# Patient Record
Sex: Female | Born: 1990 | Race: White | Hispanic: No | Smoking: Current every day smoker
Health system: Southern US, Community
[De-identification: ages and names within clinical notes are randomized; demographics above are authoritative.]

## PROBLEM LIST (undated history)

## (undated) DIAGNOSIS — F329 Major depressive disorder, single episode, unspecified: Secondary | ICD-10-CM

## (undated) DIAGNOSIS — F419 Anxiety disorder, unspecified: Secondary | ICD-10-CM

## (undated) DIAGNOSIS — F32A Depression, unspecified: Secondary | ICD-10-CM

## (undated) DIAGNOSIS — F609 Personality disorder, unspecified: Secondary | ICD-10-CM

## (undated) DIAGNOSIS — F429 Obsessive-compulsive disorder, unspecified: Secondary | ICD-10-CM

## (undated) DIAGNOSIS — R87619 Unspecified abnormal cytological findings in specimens from cervix uteri: Secondary | ICD-10-CM

## (undated) DIAGNOSIS — F319 Bipolar disorder, unspecified: Secondary | ICD-10-CM

## (undated) HISTORY — PX: CHOLECYSTECTOMY: SHX55

## (undated) HISTORY — PX: TUBAL LIGATION: SHX77

## (undated) HISTORY — DX: Obsessive-compulsive disorder, unspecified: F42.9

## (undated) HISTORY — DX: Major depressive disorder, single episode, unspecified: F32.9

## (undated) HISTORY — DX: Unspecified abnormal cytological findings in specimens from cervix uteri: R87.619

## (undated) HISTORY — DX: Bipolar disorder, unspecified: F31.9

## (undated) HISTORY — DX: Personality disorder, unspecified: F60.9

## (undated) HISTORY — DX: Depression, unspecified: F32.A

## (undated) HISTORY — PX: WISDOM TOOTH EXTRACTION: SHX21

## (undated) HISTORY — DX: Anxiety disorder, unspecified: F41.9

## (undated) HISTORY — PX: BUNIONECTOMY: SHX129

---

## 2012-03-28 DIAGNOSIS — F419 Anxiety disorder, unspecified: Secondary | ICD-10-CM | POA: Insufficient documentation

## 2012-05-10 DIAGNOSIS — IMO0002 Reserved for concepts with insufficient information to code with codable children: Secondary | ICD-10-CM | POA: Insufficient documentation

## 2013-02-12 DIAGNOSIS — Z8489 Family history of other specified conditions: Secondary | ICD-10-CM | POA: Insufficient documentation

## 2013-08-28 DIAGNOSIS — F3131 Bipolar disorder, current episode depressed, mild: Secondary | ICD-10-CM | POA: Insufficient documentation

## 2013-08-28 DIAGNOSIS — F1420 Cocaine dependence, uncomplicated: Secondary | ICD-10-CM | POA: Insufficient documentation

## 2013-08-28 DIAGNOSIS — F1220 Cannabis dependence, uncomplicated: Secondary | ICD-10-CM | POA: Insufficient documentation

## 2014-02-24 ENCOUNTER — Encounter: Payer: Self-pay | Admitting: Advanced Practice Midwife

## 2014-02-24 ENCOUNTER — Other Ambulatory Visit: Payer: Self-pay | Admitting: Advanced Practice Midwife

## 2014-02-24 ENCOUNTER — Ambulatory Visit (INDEPENDENT_AMBULATORY_CARE_PROVIDER_SITE_OTHER): Payer: Medicaid Other | Admitting: Advanced Practice Midwife

## 2014-02-24 VITALS — BP 116/84 | HR 79 | Ht 63.0 in | Wt 222.0 lb

## 2014-02-24 DIAGNOSIS — O09299 Supervision of pregnancy with other poor reproductive or obstetric history, unspecified trimester: Secondary | ICD-10-CM

## 2014-02-24 DIAGNOSIS — F1911 Other psychoactive substance abuse, in remission: Secondary | ICD-10-CM

## 2014-02-24 DIAGNOSIS — Z3491 Encounter for supervision of normal pregnancy, unspecified, first trimester: Secondary | ICD-10-CM

## 2014-02-24 DIAGNOSIS — O219 Vomiting of pregnancy, unspecified: Secondary | ICD-10-CM

## 2014-02-24 DIAGNOSIS — F32A Depression, unspecified: Secondary | ICD-10-CM | POA: Insufficient documentation

## 2014-02-24 DIAGNOSIS — R6889 Other general symptoms and signs: Secondary | ICD-10-CM

## 2014-02-24 DIAGNOSIS — F4310 Post-traumatic stress disorder, unspecified: Secondary | ICD-10-CM | POA: Insufficient documentation

## 2014-02-24 DIAGNOSIS — R87619 Unspecified abnormal cytological findings in specimens from cervix uteri: Secondary | ICD-10-CM | POA: Insufficient documentation

## 2014-02-24 DIAGNOSIS — IMO0002 Reserved for concepts with insufficient information to code with codable children: Secondary | ICD-10-CM | POA: Insufficient documentation

## 2014-02-24 DIAGNOSIS — Z3490 Encounter for supervision of normal pregnancy, unspecified, unspecified trimester: Secondary | ICD-10-CM | POA: Insufficient documentation

## 2014-02-24 DIAGNOSIS — Z3480 Encounter for supervision of other normal pregnancy, unspecified trimester: Secondary | ICD-10-CM

## 2014-02-24 DIAGNOSIS — O09893 Supervision of other high risk pregnancies, third trimester: Secondary | ICD-10-CM

## 2014-02-24 DIAGNOSIS — Z3481 Encounter for supervision of other normal pregnancy, first trimester: Secondary | ICD-10-CM

## 2014-02-24 DIAGNOSIS — F329 Major depressive disorder, single episode, unspecified: Secondary | ICD-10-CM | POA: Insufficient documentation

## 2014-02-24 MED ORDER — PROMETHAZINE HCL 25 MG PO TABS: 25.0000 mg | ORAL_TABLET | Freq: Four times a day (QID) | ORAL | Status: DC | PRN

## 2014-02-24 MED ORDER — DOXYLAMINE-PYRIDOXINE 10-10 MG PO TBEC: 2.0000 | DELAYED_RELEASE_TABLET | ORAL | Status: DC

## 2014-02-24 NOTE — Progress Notes (Signed)
Last pap this year @ [address]

## 2014-02-24 NOTE — Patient Instructions (Signed)
Second Trimester of Pregnancy The second trimester is from week 13 through week 28, months 4 through 6. The second trimester is often a time when you feel your best. Your body has also adjusted to being pregnant, and you begin to feel better physically. Usually, morning sickness has lessened or quit completely, you may have more energy, and you may have an increase in appetite. The second trimester is also a time when the fetus is growing rapidly. At the end of the sixth month, the fetus is about 9 inches long and weighs about 1 pounds. You will likely begin to feel the baby move (quickening) between 18 and 20 weeks of the pregnancy. BODY CHANGES Your body goes through many changes during pregnancy. The changes vary from woman to woman.   Your weight will continue to increase. You will notice your lower abdomen bulging out.  You may begin to get stretch marks on your hips, abdomen, and breasts.  You may develop headaches that can be relieved by medicines approved by your health care provider.  You may urinate more often because the fetus is pressing on your bladder.  You may develop or continue to have heartburn as a result of your pregnancy.  You may develop constipation because certain hormones are causing the muscles that push waste through your intestines to slow down.  You may develop hemorrhoids or swollen, bulging veins (varicose veins).  You may have back pain because of the weight gain and pregnancy hormones relaxing your joints between the bones in your pelvis and as a result of a shift in weight and the muscles that support your balance.  Your breasts will continue to grow and be tender.  Your gums may bleed and may be sensitive to brushing and flossing.  Dark spots or blotches (chloasma, mask of pregnancy) may develop on your face. This will likely fade after the baby is born.  A dark line from your belly button to the pubic area (linea nigra) may appear. This will likely fade  after the baby is born.  You may have changes in your hair. These can include thickening of your hair, rapid growth, and changes in texture. Some women also have hair loss during or after pregnancy, or hair that feels dry or thin. Your hair will most likely return to normal after your baby is born. WHAT TO EXPECT AT YOUR PRENATAL VISITS During a routine prenatal visit:  You will be weighed to make sure you and the fetus are growing normally.  Your blood pressure will be taken.  Your abdomen will be measured to track your baby's growth.  The fetal heartbeat will be listened to.  Any test results from the previous visit will be discussed. Your health care provider may ask you:  How you are feeling.  If you are feeling the baby move.  If you have had any abnormal symptoms, such as leaking fluid, bleeding, severe headaches, or abdominal cramping.  If you have any questions. Other tests that may be performed during your second trimester include:  Blood tests that check for:  Low iron levels (anemia).  Gestational diabetes (between 24 and 28 weeks).  Rh antibodies.  Urine tests to check for infections, diabetes, or protein in the urine.  An ultrasound to confirm the proper growth and development of the baby.  An amniocentesis to check for possible genetic problems.  Fetal screens for spina bifida and Down syndrome. HOME CARE INSTRUCTIONS   Avoid all smoking, herbs, alcohol, and unprescribed   drugs. These chemicals affect the formation and growth of the baby.  Follow your health care provider's instructions regarding medicine use. There are medicines that are either safe or unsafe to take during pregnancy.  Exercise only as directed by your health care provider. Experiencing uterine cramps is a good sign to stop exercising.  Continue to eat regular, healthy meals.  Wear a good support bra for breast tenderness.  Do not use hot tubs, steam rooms, or saunas.  Wear your  seat belt at all times when driving.  Avoid raw meat, uncooked cheese, cat litter boxes, and soil used by cats. These carry germs that can cause birth defects in the baby.  Take your prenatal vitamins.  Try taking a stool softener (if your health care provider approves) if you develop constipation. Eat more high-fiber foods, such as fresh vegetables or fruit and whole grains. Drink plenty of fluids to keep your urine clear or pale yellow.  Take warm sitz baths to soothe any pain or discomfort caused by hemorrhoids. Use hemorrhoid cream if your health care provider approves.  If you develop varicose veins, wear support hose. Elevate your feet for 15 minutes, 3-4 times a day. Limit salt in your diet.  Avoid heavy lifting, wear low heel shoes, and practice good posture.  Rest with your legs elevated if you have leg cramps or low back pain.  Visit your dentist if you have not gone yet during your pregnancy. Use a soft toothbrush to brush your teeth and be gentle when you floss.  A sexual relationship may be continued unless your health care provider directs you otherwise.  Continue to go to all your prenatal visits as directed by your health care provider. SEEK MEDICAL CARE IF:   You have dizziness.  You have mild pelvic cramps, pelvic pressure, or nagging pain in the abdominal area.  You have persistent nausea, vomiting, or diarrhea.  You have a bad smelling vaginal discharge.  You have pain with urination. SEEK IMMEDIATE MEDICAL CARE IF:   You have a fever.  You are leaking fluid from your vagina.  You have spotting or bleeding from your vagina.  You have severe abdominal cramping or pain.  You have rapid weight gain or loss.  You have shortness of breath with chest pain.  You notice sudden or extreme swelling of your face, hands, ankles, feet, or legs.  You have not felt your baby move in over an hour.  You have severe headaches that do not go away with  medicine.  You have vision changes. Document Released: 06/07/2001 Document Revised: 06/18/2013 Document Reviewed: 08/14/2012 ExitCare Patient Information 2015 [employer]. This information is not intended to replace advice given to you by your health care provider. Make sure you discuss any questions you have with your health care provider.  

## 2014-02-24 NOTE — Progress Notes (Signed)
Subjective:    Virginia Sanders is a [id number] [redacted]w[redacted]d being seen today for her first obstetrical visit.  Her obstetrical history is significant for obesity, pregnancy induced hypertension and previous C/S for second stage arrest. Patient does intend to breast feed. Pregnancy history fully reviewed.  Patient reports no bleeding and no cramping. Reports N/V daily. Usually once or twice, but sometimes 10x/day. Has not tried anything for N/V. Also reports occasional "flutters" in her heartbeat. Thinks it may be anxiety. No Chest pain, SOB or tachycardia. Has not seen a cardiologist.   Last Pap/annual gyn exam at [facility] beginning of 2015. Hx ASCUS per pt.  Hx substance abuse. (Cocaine, MJ per chart)  Plans repeat C/S w/ BTL.  Filed Vitals:   02/24/14 1032 02/24/14 1036  BP: 116/84   Pulse: 79   Height:   (1.6 m)  Weight: 222 lb (100.699 kg)     HISTORY: OB History  Gravida Para Term Preterm AB SAB TAB Ectopic Multiple Living  # Outcome Date GA Lbr Len/2nd Weight Sex Delivery Anes PTL Lv  4 CUR           3 TRM 02/24/10   8 lb 3 oz (3.714 kg) M CS EPI    2 SAB           1 SAB              Past Medical History  Diagnosis Date  . Depression   . Anxiety   . Bipolar 1 disorder   . Abnormal Pap smear of cervix     Colpo   Past Surgical History  Procedure Laterality Date  . Bladder repair w/ cesarean section    . Wisdom tooth extraction     Family History  Problem Relation Age of Onset  . Diabetes Maternal Grandmother   . Diabetes Maternal Grandfather   . Hypertension Father   . Hypertension Maternal Grandfather   . Heart attack Paternal Grandmother   . Heart murmur Brother      Exam    Uterus:   at [facility]. + cardiac activity and fetal mvmt per informal BS [place].   Pelvic Exam:    Perineum: deferred  System: Breast:  deferred    Skin: normal coloration and turgor, no rashes    Neurologic: oriented, normal, normal mood   Extremities: normal  strength, tone, and muscle mass   HEENT PERRLA and sclera clear, anicteric   Mouth/Teeth mucous membranes moist, pharynx normal without lesions and dental hygiene good   Neck supple and no masses   Cardiovascular: regular rate and rhythm, faint systolic murmur.    Respiratory:  appears well, vitals normal, no respiratory distress, acyanotic, normal RR, neck free of mass or lymphadenopathy, chest clear, no wheezing, crepitations, rhonchi, normal symmetric air entry   Abdomen: soft, non-tender; bowel sounds normal; no masses,  no organomegaly   Urinary: Deferred      Assessment:    Pregnancy: [id number] Patient Active Problem List   Diagnosis Date Noted  . ASCUS with positive high risk HPV 02/24/2014  . Normal pregnancy 02/24/2014     1. History of drug abuse  - Prescription Monitoring Profile (17)-[facility]  2. ASCUS with positive high risk HPV  - Pap smear  3. Encounter for supervision of other normal pregnancy in first trimester  - Prenatal (OB Panel) - HIV antibody (with reflex) - CULTURE, URINE COMPREHENSIVE - GC/Chlamydia Probe  Amp - US Fetal Nuchal Translucency Measurement; Future  4. Nausea and vomiting of pregnancy, antepartum  - Doxylamine-Pyridoxine (DICLEGIS) 10-10 MG TBEC; Take 2 tablets by mouth as directed.  Dispense: 100 tablet; Refill: 1 - promethazine (PHENERGAN) 25 MG tablet; Take 1 tablet (25 mg total) by mouth every 6 (six) hours as needed for nausea or vomiting.  Dispense: 30 tablet; Refill: 1    Plan:    Initial labs drawn. Prenatal vitamins. Problem list reviewed and updated. Genetic Screening discussed First Screen: ordered.  Ultrasound discussed; fetal survey: requested.  Follow up in 4 weeks. 75% of 40 min visit spent on counseling and coordination of care.  Baseline 24-hour urine Prescription drug monitoring Small frequent meals. Request pap and C/S records.    Dorathy Kinsman 02/24/2014

## 2014-02-24 NOTE — Addendum Note (Signed)
Addended by: Granville Lewis on: 02/24/2014 02:34 PM   Modules accepted: Orders

## 2014-02-25 ENCOUNTER — Ambulatory Visit (HOSPITAL_COMMUNITY)
Admission: RE | Admit: 2014-02-25 | Discharge: 2014-02-25 | Disposition: A | Discharge status: Home / Self Care | Payer: Medicaid Other | Source: Ambulatory Visit | Attending: Obstetrics & Gynecology | Admitting: Obstetrics & Gynecology

## 2014-02-25 ENCOUNTER — Encounter (HOSPITAL_COMMUNITY): Payer: Self-pay

## 2014-02-25 ENCOUNTER — Other Ambulatory Visit: Payer: Self-pay | Admitting: Advanced Practice Midwife

## 2014-02-25 ENCOUNTER — Ambulatory Visit (HOSPITAL_COMMUNITY): Admission: RE | Admit: 2014-02-25 | Payer: Medicaid Other | Source: Ambulatory Visit

## 2014-02-25 VITALS — BP 115/73 | HR 80 | Wt 225.2 lb

## 2014-02-25 DIAGNOSIS — Z3481 Encounter for supervision of other normal pregnancy, first trimester: Secondary | ICD-10-CM

## 2014-02-25 DIAGNOSIS — Z36 Encounter for antenatal screening of mother: Secondary | ICD-10-CM | POA: Diagnosis present

## 2014-02-25 LAB — OBSTETRIC PANEL
Antibody Screen: NEGATIVE
Basophils Absolute: 0 10*3/uL (ref 0.0–0.1)
Basophils Relative: 0 % (ref 0–1)
Eosinophils Absolute: 0.1 10*3/uL (ref 0.0–0.7)
Eosinophils Relative: 1 % (ref 0–5)
HCT: 40.2 % (ref 36.0–46.0)
Hemoglobin: 13.7 g/dL (ref 12.0–15.0)
Hepatitis B Surface Ag: NEGATIVE
Lymphocytes Relative: 28 % (ref 12–46)
Lymphs Abs: 2.9 10*3/uL (ref 0.7–4.0)
MCH: 30.8 pg (ref 26.0–34.0)
MCHC: 34.1 g/dL (ref 30.0–36.0)
MCV: 90.3 fL (ref 78.0–100.0)
Monocytes Absolute: 0.6 10*3/uL (ref 0.1–1.0)
Monocytes Relative: 6 % (ref 3–12)
Neutro Abs: 6.7 10*3/uL (ref 1.7–7.7)
Neutrophils Relative %: 65 % (ref 43–77)
Platelets: 341 10*3/uL (ref 150–400)
RBC: 4.45 MIL/uL (ref 3.87–5.11)
RDW: 13.7 % (ref 11.5–15.5)
Rh Type: POSITIVE
Rubella: 1.81 Index — ABNORMAL HIGH (ref <0.90)
WBC: 10.3 10*3/uL (ref 4.0–10.5)

## 2014-02-25 LAB — HIV ANTIBODY (ROUTINE TESTING W REFLEX): HIV 1&2 Ab, 4th Generation: NONREACTIVE

## 2014-02-26 LAB — GC/CHLAMYDIA PROBE AMP
CT Probe RNA: NEGATIVE
GC Probe RNA: NEGATIVE

## 2014-02-26 LAB — CULTURE, URINE COMPREHENSIVE
Colony Count: NO GROWTH
Organism ID, Bacteria: NO GROWTH

## 2014-02-27 LAB — CANNABANOIDS (GC/LC/MS), URINE: THC-COOH (GC/LC/MS), ur confirm: 318 ng/mL — AB (ref <5)

## 2014-02-28 ENCOUNTER — Other Ambulatory Visit: Payer: Self-pay | Admitting: Obstetrics & Gynecology

## 2014-02-28 DIAGNOSIS — Z3682 Encounter for antenatal screening for nuchal translucency: Secondary | ICD-10-CM

## 2014-02-28 LAB — PRESCRIPTION MONITORING PROFILE (SOLSTAS)
Amphetamine/Meth: NEGATIVE ng/mL
Barbiturate Screen, Urine: NEGATIVE ng/mL
Benzodiazepine Screen, Urine: NEGATIVE ng/mL
Buprenorphine, Urine: NEGATIVE ng/mL
Carisoprodol, Urine: NEGATIVE ng/mL
Cocaine Metabolites: NEGATIVE ng/mL
Creatinine, Urine: 205.11 mg/dL (ref >20.0)
Fentanyl, Ur: NEGATIVE ng/mL
MDMA URINE: NEGATIVE ng/mL
Meperidine, Ur: NEGATIVE ng/mL
Methadone Screen, Urine: NEGATIVE ng/mL
Nitrites, Initial: NEGATIVE ug/mL
Opiate Screen, Urine: NEGATIVE ng/mL
Oxycodone Screen, Ur: NEGATIVE ng/mL
Propoxyphene: NEGATIVE ng/mL
Tapentadol, urine: NEGATIVE ng/mL
Tramadol Scrn, Ur: NEGATIVE ng/mL
Zolpidem, Urine: NEGATIVE ng/mL
pH, Initial: 7.6 pH (ref 4.5–8.9)

## 2014-03-06 ENCOUNTER — Telehealth: Payer: Self-pay | Admitting: *Deleted

## 2014-03-06 DIAGNOSIS — Z3482 Encounter for supervision of other normal pregnancy, second trimester: Secondary | ICD-10-CM

## 2014-03-06 MED ORDER — PRENATAL 27-0.8 MG PO TABS: 1.0000 | ORAL_TABLET | Freq: Every day | ORAL | Status: DC

## 2014-03-06 NOTE — Telephone Encounter (Signed)
Sent Prenatal Vitamins to pharmacy per pt request.

## 2014-03-07 ENCOUNTER — Other Ambulatory Visit: Payer: Self-pay

## 2014-03-07 ENCOUNTER — Ambulatory Visit (HOSPITAL_COMMUNITY)
Admission: RE | Admit: 2014-03-07 | Discharge: 2014-03-07 | Disposition: A | Discharge status: Home / Self Care | Payer: Medicaid Other | Source: Ambulatory Visit | Attending: Obstetrics & Gynecology | Admitting: Obstetrics & Gynecology

## 2014-03-07 DIAGNOSIS — Z36 Encounter for antenatal screening of mother: Secondary | ICD-10-CM | POA: Insufficient documentation

## 2014-03-07 DIAGNOSIS — Z3682 Encounter for antenatal screening for nuchal translucency: Secondary | ICD-10-CM

## 2014-03-08 ENCOUNTER — Encounter: Payer: Self-pay | Admitting: Advanced Practice Midwife

## 2014-03-08 DIAGNOSIS — O34219 Maternal care for unspecified type scar from previous cesarean delivery: Secondary | ICD-10-CM | POA: Insufficient documentation

## 2014-03-08 DIAGNOSIS — O139 Gestational [pregnancy-induced] hypertension without significant proteinuria, unspecified trimester: Secondary | ICD-10-CM | POA: Insufficient documentation

## 2014-03-08 LAB — COMPREHENSIVE METABOLIC PANEL
ALT: 14 U/L (ref 0–35)
AST: 14 U/L (ref 0–37)
Albumin: 3.6 g/dL (ref 3.5–5.2)
Alkaline Phosphatase: 52 U/L (ref 39–117)
BUN: 10 mg/dL (ref 6–23)
CO2: 23 mEq/L (ref 19–32)
Calcium: 8.7 mg/dL (ref 8.4–10.5)
Chloride: 106 mEq/L (ref 96–112)
Creat: 0.51 mg/dL (ref 0.50–1.10)
Glucose, Bld: 71 mg/dL (ref 70–99)
Potassium: 3.8 mEq/L (ref 3.5–5.3)
Sodium: 136 mEq/L (ref 135–145)
Total Bilirubin: 0.2 mg/dL (ref 0.2–1.2)
Total Protein: 5.9 g/dL — ABNORMAL LOW (ref 6.0–8.3)

## 2014-03-08 LAB — CBC
HCT: 36 % (ref 36.0–46.0)
Hemoglobin: 12.6 g/dL (ref 12.0–15.0)
MCH: 30.6 pg (ref 26.0–34.0)
MCHC: 35 g/dL (ref 30.0–36.0)
MCV: 87.4 fL (ref 78.0–100.0)
Platelets: 259 10*3/uL (ref 150–400)
RBC: 4.12 MIL/uL (ref 3.87–5.11)
RDW: 13.7 % (ref 11.5–15.5)
WBC: 10.3 10*3/uL (ref 4.0–10.5)

## 2014-03-08 LAB — CREATININE CLEARANCE, URINE, 24 HOUR
Creatinine Clearance: 211 mL/min — ABNORMAL HIGH (ref 75–115)
Creatinine, 24H Ur: 1550 mg/d (ref 700–1800)
Creatinine, Urine: 163.2 mg/dL
Creatinine: 0.51 mg/dL (ref 0.50–1.10)

## 2014-03-08 LAB — PROTEIN, URINE, 24 HOUR
Protein, 24H Urine: 95 mg/d (ref <150)
Protein, Urine: 10 mg/dL (ref 5–24)

## 2014-03-24 ENCOUNTER — Ambulatory Visit (INDEPENDENT_AMBULATORY_CARE_PROVIDER_SITE_OTHER): Payer: Medicaid Other | Admitting: Obstetrics & Gynecology

## 2014-03-24 VITALS — BP 110/73 | HR 75 | Wt 226.0 lb

## 2014-03-24 DIAGNOSIS — E669 Obesity, unspecified: Secondary | ICD-10-CM

## 2014-03-24 DIAGNOSIS — O34219 Maternal care for unspecified type scar from previous cesarean delivery: Secondary | ICD-10-CM

## 2014-03-24 DIAGNOSIS — O99210 Obesity complicating pregnancy, unspecified trimester: Secondary | ICD-10-CM | POA: Insufficient documentation

## 2014-03-24 DIAGNOSIS — Z3490 Encounter for supervision of normal pregnancy, unspecified, unspecified trimester: Secondary | ICD-10-CM

## 2014-03-24 DIAGNOSIS — Z23 Encounter for immunization: Secondary | ICD-10-CM

## 2014-03-24 DIAGNOSIS — Z3480 Encounter for supervision of other normal pregnancy, unspecified trimester: Secondary | ICD-10-CM

## 2014-03-24 NOTE — Progress Notes (Signed)
Routine visit. No problems. We discussed rec'd weight gain and risks of obesity with pregnancy. She will get an early glucola in [redacted] weeks along with her MSAFP. Her anatomy scan will be in 4 weeks.Flu vaccine today.

## 2014-03-26 ENCOUNTER — Encounter: Payer: Self-pay | Admitting: *Deleted

## 2014-04-07 ENCOUNTER — Other Ambulatory Visit: Payer: Medicaid Other

## 2014-04-07 DIAGNOSIS — Z3492 Encounter for supervision of normal pregnancy, unspecified, second trimester: Secondary | ICD-10-CM

## 2014-04-08 LAB — ALPHA FETOPROTEIN, MATERNAL
AFP: 29.8 ng/mL
Curr Gest Age: 17.1 wks.days
MoM for AFP: 1
Open Spina bifida: NEGATIVE
Osb Risk: 1:13300 {titer}

## 2014-04-08 LAB — GLUCOSE TOLERANCE, 1 HOUR (50G) W/O FASTING: Glucose, 1 Hour GTT: 92 mg/dL (ref 70–140)

## 2014-04-09 ENCOUNTER — Telehealth: Payer: Self-pay | Admitting: *Deleted

## 2014-04-09 ENCOUNTER — Encounter: Payer: Self-pay | Admitting: Obstetrics & Gynecology

## 2014-04-09 NOTE — Telephone Encounter (Signed)
Called pt to adv glucose test was normal - but can't leave a message

## 2014-04-21 ENCOUNTER — Ambulatory Visit (HOSPITAL_COMMUNITY)
Admission: RE | Admit: 2014-04-21 | Discharge: 2014-04-21 | Disposition: A | Discharge status: Home / Self Care | Payer: Medicaid Other | Source: Ambulatory Visit | Attending: Obstetrics & Gynecology | Admitting: Obstetrics & Gynecology

## 2014-04-21 ENCOUNTER — Encounter: Payer: Self-pay | Admitting: Obstetrics & Gynecology

## 2014-04-21 DIAGNOSIS — Z3A19 19 weeks gestation of pregnancy: Secondary | ICD-10-CM | POA: Diagnosis not present

## 2014-04-21 DIAGNOSIS — Z36 Encounter for antenatal screening of mother: Secondary | ICD-10-CM | POA: Insufficient documentation

## 2014-04-21 DIAGNOSIS — Z3490 Encounter for supervision of normal pregnancy, unspecified, unspecified trimester: Secondary | ICD-10-CM

## 2014-04-21 DIAGNOSIS — O99212 Obesity complicating pregnancy, second trimester: Secondary | ICD-10-CM | POA: Diagnosis not present

## 2014-04-21 DIAGNOSIS — O34219 Maternal care for unspecified type scar from previous cesarean delivery: Secondary | ICD-10-CM

## 2014-04-21 DIAGNOSIS — O99322 Drug use complicating pregnancy, second trimester: Secondary | ICD-10-CM | POA: Insufficient documentation

## 2014-04-21 DIAGNOSIS — Z3689 Encounter for other specified antenatal screening: Secondary | ICD-10-CM

## 2014-04-21 DIAGNOSIS — O3421 Maternal care for scar from previous cesarean delivery: Secondary | ICD-10-CM | POA: Diagnosis present

## 2014-04-21 DIAGNOSIS — Z8759 Personal history of other complications of pregnancy, childbirth and the puerperium: Secondary | ICD-10-CM

## 2014-04-21 DIAGNOSIS — F1220 Cannabis dependence, uncomplicated: Secondary | ICD-10-CM | POA: Insufficient documentation

## 2014-04-28 ENCOUNTER — Ambulatory Visit (INDEPENDENT_AMBULATORY_CARE_PROVIDER_SITE_OTHER): Payer: Medicaid Other | Admitting: Advanced Practice Midwife

## 2014-04-28 VITALS — BP 114/76 | HR 81 | Wt 231.0 lb

## 2014-04-28 DIAGNOSIS — Z3490 Encounter for supervision of normal pregnancy, unspecified, unspecified trimester: Secondary | ICD-10-CM

## 2014-04-28 NOTE — Progress Notes (Signed)
Feels well. Anatomy US was normal but incomplete. Will repeat in a month.

## 2014-04-28 NOTE — Patient Instructions (Signed)
Second Trimester of Pregnancy The second trimester is from week 13 through week 28, months 4 through 6. The second trimester is often a time when you feel your best. Your body has also adjusted to being pregnant, and you begin to feel better physically. Usually, morning sickness has lessened or quit completely, you may have more energy, and you may have an increase in appetite. The second trimester is also a time when the fetus is growing rapidly. At the end of the sixth month, the fetus is about 9 inches long and weighs about 1 pounds. You will likely begin to feel the baby move (quickening) between 18 and 20 weeks of the pregnancy. BODY CHANGES Your body goes through many changes during pregnancy. The changes vary from woman to woman.   Your weight will continue to increase. You will notice your lower abdomen bulging out.  You may begin to get stretch marks on your hips, abdomen, and breasts.  You may develop headaches that can be relieved by medicines approved by your health care provider.  You may urinate more often because the fetus is pressing on your bladder.  You may develop or continue to have heartburn as a result of your pregnancy.  You may develop constipation because certain hormones are causing the muscles that push waste through your intestines to slow down.  You may develop hemorrhoids or swollen, bulging veins (varicose veins).  You may have back pain because of the weight gain and pregnancy hormones relaxing your joints between the bones in your pelvis and as a result of a shift in weight and the muscles that support your balance.  Your breasts will continue to grow and be tender.  Your gums may bleed and may be sensitive to brushing and flossing.  Dark spots or blotches (chloasma, mask of pregnancy) may develop on your face. This will likely fade after the baby is born.  A dark line from your belly button to the pubic area (linea nigra) may appear. This will likely fade  after the baby is born.  You may have changes in your hair. These can include thickening of your hair, rapid growth, and changes in texture. Some women also have hair loss during or after pregnancy, or hair that feels dry or thin. Your hair will most likely return to normal after your baby is born. WHAT TO EXPECT AT YOUR PRENATAL VISITS During a routine prenatal visit:  You will be weighed to make sure you and the fetus are growing normally.  Your blood pressure will be taken.  Your abdomen will be measured to track your baby's growth.  The fetal heartbeat will be listened to.  Any test results from the previous visit will be discussed. Your health care provider may ask you:  How you are feeling.  If you are feeling the baby move.  If you have had any abnormal symptoms, such as leaking fluid, bleeding, severe headaches, or abdominal cramping.  If you have any questions. Other tests that may be performed during your second trimester include:  Blood tests that check for:  Low iron levels (anemia).  Gestational diabetes (between 24 and 28 weeks).  Rh antibodies.  Urine tests to check for infections, diabetes, or protein in the urine.  An ultrasound to confirm the proper growth and development of the baby.  An amniocentesis to check for possible genetic problems.  Fetal screens for spina bifida and Down syndrome. HOME CARE INSTRUCTIONS   Avoid all smoking, herbs, alcohol, and unprescribed   drugs. These chemicals affect the formation and growth of the baby.  Follow your health care provider's instructions regarding medicine use. There are medicines that are either safe or unsafe to take during pregnancy.  Exercise only as directed by your health care provider. Experiencing uterine cramps is a good sign to stop exercising.  Continue to eat regular, healthy meals.  Wear a good support bra for breast tenderness.  Do not use hot tubs, steam rooms, or saunas.  Wear your  seat belt at all times when driving.  Avoid raw meat, uncooked cheese, cat litter boxes, and soil used by cats. These carry germs that can cause birth defects in the baby.  Take your prenatal vitamins.  Try taking a stool softener (if your health care provider approves) if you develop constipation. Eat more high-fiber foods, such as fresh vegetables or fruit and whole grains. Drink plenty of fluids to keep your urine clear or pale yellow.  Take warm sitz baths to soothe any pain or discomfort caused by hemorrhoids. Use hemorrhoid cream if your health care provider approves.  If you develop varicose veins, wear support hose. Elevate your feet for 15 minutes, 3-4 times a day. Limit salt in your diet.  Avoid heavy lifting, wear low heel shoes, and practice good posture.  Rest with your legs elevated if you have leg cramps or low back pain.  Visit your dentist if you have not gone yet during your pregnancy. Use a soft toothbrush to brush your teeth and be gentle when you floss.  A sexual relationship may be continued unless your health care provider directs you otherwise.  Continue to go to all your prenatal visits as directed by your health care provider. SEEK MEDICAL CARE IF:   You have dizziness.  You have mild pelvic cramps, pelvic pressure, or nagging pain in the abdominal area.  You have persistent nausea, vomiting, or diarrhea.  You have a bad smelling vaginal discharge.  You have pain with urination. SEEK IMMEDIATE MEDICAL CARE IF:   You have a fever.  You are leaking fluid from your vagina.  You have spotting or bleeding from your vagina.  You have severe abdominal cramping or pain.  You have rapid weight gain or loss.  You have shortness of breath with chest pain.  You notice sudden or extreme swelling of your face, hands, ankles, feet, or legs.  You have not felt your baby move in over an hour.  You have severe headaches that do not go away with  medicine.  You have vision changes. Document Released: 06/07/2001 Document Revised: 06/18/2013 Document Reviewed: 08/14/2012 ExitCare Patient Information 2015 [employer]. This information is not intended to replace advice given to you by your health care provider. Make sure you discuss any questions you have with your health care provider.  

## 2014-04-29 ENCOUNTER — Encounter (HOSPITAL_COMMUNITY): Payer: Self-pay

## 2014-05-26 ENCOUNTER — Encounter: Payer: Self-pay | Admitting: *Deleted

## 2014-05-26 ENCOUNTER — Ambulatory Visit (INDEPENDENT_AMBULATORY_CARE_PROVIDER_SITE_OTHER): Payer: Medicaid Other | Admitting: Advanced Practice Midwife

## 2014-05-26 VITALS — BP 124/74 | HR 89 | Wt 234.0 lb

## 2014-05-26 DIAGNOSIS — O3421 Maternal care for scar from previous cesarean delivery: Secondary | ICD-10-CM

## 2014-05-26 DIAGNOSIS — O34219 Maternal care for unspecified type scar from previous cesarean delivery: Secondary | ICD-10-CM

## 2014-05-26 DIAGNOSIS — R002 Palpitations: Secondary | ICD-10-CM

## 2014-05-26 NOTE — Progress Notes (Signed)
Doing well.  Good fetal movement, denies vaginal bleeding, LOF, regular contractions.  BTL consent reviewed/papers signed today. Repeat anatomy U/S scheduled tomorrow.

## 2014-05-26 NOTE — Addendum Note (Signed)
Addended by: Sharen CounterLEFTWICH-KIRBY, Shalva Rozycki A on: 05/26/2014 02:07 PM   Modules accepted: Orders

## 2014-05-27 ENCOUNTER — Ambulatory Visit (HOSPITAL_COMMUNITY)
Admission: RE | Admit: 2014-05-27 | Discharge: 2014-05-27 | Disposition: A | Discharge status: Home / Self Care | Payer: Medicaid Other | Source: Ambulatory Visit | Attending: Advanced Practice Midwife | Admitting: Advanced Practice Midwife

## 2014-05-27 DIAGNOSIS — O3421 Maternal care for scar from previous cesarean delivery: Secondary | ICD-10-CM | POA: Diagnosis not present

## 2014-05-27 DIAGNOSIS — O09292 Supervision of pregnancy with other poor reproductive or obstetric history, second trimester: Secondary | ICD-10-CM | POA: Insufficient documentation

## 2014-05-27 DIAGNOSIS — O99212 Obesity complicating pregnancy, second trimester: Secondary | ICD-10-CM | POA: Insufficient documentation

## 2014-05-27 DIAGNOSIS — O99322 Drug use complicating pregnancy, second trimester: Secondary | ICD-10-CM | POA: Insufficient documentation

## 2014-05-27 DIAGNOSIS — F1210 Cannabis abuse, uncomplicated: Secondary | ICD-10-CM | POA: Insufficient documentation

## 2014-05-27 DIAGNOSIS — Z3490 Encounter for supervision of normal pregnancy, unspecified, unspecified trimester: Secondary | ICD-10-CM

## 2014-05-27 DIAGNOSIS — F1410 Cocaine abuse, uncomplicated: Secondary | ICD-10-CM | POA: Diagnosis not present

## 2014-05-27 DIAGNOSIS — Z3A24 24 weeks gestation of pregnancy: Secondary | ICD-10-CM | POA: Diagnosis not present

## 2014-05-28 DIAGNOSIS — Z0489 Encounter for examination and observation for other specified reasons: Secondary | ICD-10-CM | POA: Insufficient documentation

## 2014-05-28 DIAGNOSIS — IMO0002 Reserved for concepts with insufficient information to code with codable children: Secondary | ICD-10-CM | POA: Insufficient documentation

## 2014-05-28 DIAGNOSIS — Z3A24 24 weeks gestation of pregnancy: Secondary | ICD-10-CM | POA: Insufficient documentation

## 2014-06-02 ENCOUNTER — Ambulatory Visit (INDEPENDENT_AMBULATORY_CARE_PROVIDER_SITE_OTHER): Payer: Medicaid Other | Admitting: Obstetrics & Gynecology

## 2014-06-02 VITALS — BP 131/82 | HR 98 | Temp 98.2°F | Wt 233.0 lb

## 2014-06-02 DIAGNOSIS — R05 Cough: Secondary | ICD-10-CM

## 2014-06-02 DIAGNOSIS — R509 Fever, unspecified: Secondary | ICD-10-CM

## 2014-06-02 DIAGNOSIS — R062 Wheezing: Secondary | ICD-10-CM

## 2014-06-02 DIAGNOSIS — R110 Nausea: Secondary | ICD-10-CM

## 2014-06-02 LAB — POCT INFLUENZA A/B
Influenza A, POC: NEGATIVE
Influenza B, POC: NEGATIVE

## 2014-06-02 MED ORDER — AZITHROMYCIN 250 MG PO TABS: ORAL_TABLET | ORAL | Status: DC

## 2014-06-02 MED ORDER — PREDNISONE 20 MG PO TABS: ORAL_TABLET | ORAL | Status: DC

## 2014-06-02 NOTE — Progress Notes (Signed)
Pt has flu Sx. Cough, wheezing, fever of 99-100, chest tightness.

## 2014-06-02 NOTE — Progress Notes (Signed)
Pt c/o 4 day history of cough, fever, whezing, SOB and ear pain.  Mother is sick contact.  Mild nausea.  No vomiting or diarrhea. Flu negative Lungs rhonchi and wheezing bilaterally. Rx with azithromycin and prednisone 40 mg for 5 days If worsens, RTC.

## 2014-06-23 ENCOUNTER — Encounter: Payer: Self-pay | Admitting: Obstetrics & Gynecology

## 2014-06-23 ENCOUNTER — Ambulatory Visit (INDEPENDENT_AMBULATORY_CARE_PROVIDER_SITE_OTHER): Payer: Medicaid Other | Admitting: Obstetrics & Gynecology

## 2014-06-23 VITALS — BP 131/86 | HR 92 | Wt 235.0 lb

## 2014-06-23 DIAGNOSIS — O34219 Maternal care for unspecified type scar from previous cesarean delivery: Secondary | ICD-10-CM

## 2014-06-23 DIAGNOSIS — Z3483 Encounter for supervision of other normal pregnancy, third trimester: Secondary | ICD-10-CM

## 2014-06-23 DIAGNOSIS — O3421 Maternal care for scar from previous cesarean delivery: Secondary | ICD-10-CM

## 2014-06-23 DIAGNOSIS — Z23 Encounter for immunization: Secondary | ICD-10-CM

## 2014-06-23 DIAGNOSIS — Z36 Encounter for antenatal screening of mother: Secondary | ICD-10-CM

## 2014-06-23 NOTE — Progress Notes (Signed)
Routine visit. Good FM. Feeling much better. No problems. Glucola, labs, tdap today.

## 2014-06-24 ENCOUNTER — Telehealth: Payer: Self-pay | Admitting: *Deleted

## 2014-06-24 LAB — GLUCOSE TOLERANCE, 1 HOUR (50G) W/O FASTING: Glucose, 1 Hour GTT: 119 mg/dL (ref 70–140)

## 2014-06-24 LAB — CBC
HCT: 35.5 % — ABNORMAL LOW (ref 36.0–46.0)
Hemoglobin: 12.5 g/dL (ref 12.0–15.0)
MCH: 30.7 pg (ref 26.0–34.0)
MCHC: 35.2 g/dL (ref 30.0–36.0)
MCV: 87.2 fL (ref 78.0–100.0)
MPV: 10.2 fL (ref 9.4–12.4)
Platelets: 290 10*3/uL (ref 150–400)
RBC: 4.07 MIL/uL (ref 3.87–5.11)
RDW: 13.1 % (ref 11.5–15.5)
WBC: 12 10*3/uL — ABNORMAL HIGH (ref 4.0–10.5)

## 2014-06-24 LAB — HIV ANTIBODY (ROUTINE TESTING W REFLEX): HIV 1&2 Ab, 4th Generation: NONREACTIVE

## 2014-06-24 LAB — RPR

## 2014-06-24 NOTE — Telephone Encounter (Signed)
Attempted to leave message of normal 1 hr GTT but pt not available.

## 2014-07-07 ENCOUNTER — Ambulatory Visit (INDEPENDENT_AMBULATORY_CARE_PROVIDER_SITE_OTHER): Payer: Medicaid Other | Admitting: Obstetrics & Gynecology

## 2014-07-07 VITALS — BP 128/86 | HR 99 | Wt 231.0 lb

## 2014-07-07 DIAGNOSIS — Z3490 Encounter for supervision of normal pregnancy, unspecified, unspecified trimester: Secondary | ICD-10-CM

## 2014-07-07 NOTE — Progress Notes (Signed)
Wants clitorial area checked due to a cyst that has popped up

## 2014-07-07 NOTE — Progress Notes (Signed)
Pt had lump at clitoris but no gone.  Appeared after sex.  No lesion or mass seen today.  Discussed vulvar hygiene.  No further workup needs to be done.

## 2014-07-08 ENCOUNTER — Encounter: Payer: Self-pay | Admitting: *Deleted

## 2014-07-21 ENCOUNTER — Ambulatory Visit (INDEPENDENT_AMBULATORY_CARE_PROVIDER_SITE_OTHER): Payer: Medicaid Other | Admitting: Obstetrics & Gynecology

## 2014-07-21 VITALS — BP 114/78 | HR 89 | Wt 237.0 lb

## 2014-07-21 DIAGNOSIS — Z3483 Encounter for supervision of other normal pregnancy, third trimester: Secondary | ICD-10-CM

## 2014-07-21 DIAGNOSIS — O3421 Maternal care for scar from previous cesarean delivery: Secondary | ICD-10-CM

## 2014-07-21 DIAGNOSIS — O34219 Maternal care for unspecified type scar from previous cesarean delivery: Secondary | ICD-10-CM

## 2014-07-21 NOTE — Progress Notes (Signed)
Routine visit. Good FM. No problems. Clitoral lump has resolved.

## 2014-08-04 ENCOUNTER — Ambulatory Visit (INDEPENDENT_AMBULATORY_CARE_PROVIDER_SITE_OTHER): Payer: Medicaid Other | Admitting: Obstetrics & Gynecology

## 2014-08-04 ENCOUNTER — Encounter: Payer: Self-pay | Admitting: Obstetrics & Gynecology

## 2014-08-04 VITALS — BP 126/90 | HR 90 | Wt 241.0 lb

## 2014-08-04 DIAGNOSIS — N9489 Other specified conditions associated with female genital organs and menstrual cycle: Secondary | ICD-10-CM

## 2014-08-04 DIAGNOSIS — O34219 Maternal care for unspecified type scar from previous cesarean delivery: Secondary | ICD-10-CM

## 2014-08-04 DIAGNOSIS — Z3A34 34 weeks gestation of pregnancy: Secondary | ICD-10-CM

## 2014-08-04 DIAGNOSIS — O3483 Maternal care for other abnormalities of pelvic organs, third trimester: Secondary | ICD-10-CM

## 2014-08-04 DIAGNOSIS — Z3483 Encounter for supervision of other normal pregnancy, third trimester: Secondary | ICD-10-CM

## 2014-08-04 NOTE — Progress Notes (Signed)
Routine visit. Good FM. No OB problems. The right clitoral hood cyst has returned and it is very painful. After offering watchful waiting versus draining it with a needle, she opted for the needle drainage. I prepped the area with betadine and used a 18 gauge needle to drain about 1+cc of clear fluid. Hemostasis was achieved with pressure. Cultures at next visit.

## 2014-08-04 NOTE — Progress Notes (Signed)
Check clitoral cyst.  Very painful

## 2014-08-11 ENCOUNTER — Encounter: Payer: Medicaid Other | Admitting: Obstetrics & Gynecology

## 2014-08-18 ENCOUNTER — Ambulatory Visit (INDEPENDENT_AMBULATORY_CARE_PROVIDER_SITE_OTHER): Payer: Medicaid Other | Admitting: Obstetrics & Gynecology

## 2014-08-18 VITALS — BP 134/89 | HR 100 | Wt 244.0 lb

## 2014-08-18 DIAGNOSIS — Z36 Encounter for antenatal screening of mother: Secondary | ICD-10-CM

## 2014-08-18 DIAGNOSIS — O99210 Obesity complicating pregnancy, unspecified trimester: Secondary | ICD-10-CM

## 2014-08-18 DIAGNOSIS — E669 Obesity, unspecified: Secondary | ICD-10-CM

## 2014-08-18 DIAGNOSIS — Z3493 Encounter for supervision of normal pregnancy, unspecified, third trimester: Secondary | ICD-10-CM

## 2014-08-19 LAB — GC/CHLAMYDIA PROBE AMP
CT Probe RNA: NEGATIVE
GC Probe RNA: NEGATIVE

## 2014-08-20 LAB — CULTURE, BETA STREP (GROUP B ONLY)

## 2014-08-25 ENCOUNTER — Ambulatory Visit (INDEPENDENT_AMBULATORY_CARE_PROVIDER_SITE_OTHER): Payer: Medicaid Other | Admitting: Obstetrics & Gynecology

## 2014-08-25 ENCOUNTER — Inpatient Hospital Stay (HOSPITAL_COMMUNITY)
Admission: AD | Admit: 2014-08-25 | Discharge: 2014-08-25 | Disposition: A | Discharge status: Home / Self Care | Payer: Medicaid Other | Source: Ambulatory Visit | Attending: Obstetrics and Gynecology | Admitting: Obstetrics and Gynecology

## 2014-08-25 ENCOUNTER — Encounter (HOSPITAL_COMMUNITY): Payer: Self-pay | Admitting: *Deleted

## 2014-08-25 ENCOUNTER — Encounter: Payer: Self-pay | Admitting: Obstetrics & Gynecology

## 2014-08-25 VITALS — BP 141/91 | HR 108 | Wt 247.0 lb

## 2014-08-25 DIAGNOSIS — R030 Elevated blood-pressure reading, without diagnosis of hypertension: Secondary | ICD-10-CM | POA: Diagnosis present

## 2014-08-25 DIAGNOSIS — IMO0001 Reserved for inherently not codable concepts without codable children: Secondary | ICD-10-CM

## 2014-08-25 DIAGNOSIS — O471 False labor at or after 37 completed weeks of gestation: Secondary | ICD-10-CM

## 2014-08-25 DIAGNOSIS — O9989 Other specified diseases and conditions complicating pregnancy, childbirth and the puerperium: Secondary | ICD-10-CM | POA: Diagnosis not present

## 2014-08-25 DIAGNOSIS — Z87891 Personal history of nicotine dependence: Secondary | ICD-10-CM | POA: Insufficient documentation

## 2014-08-25 DIAGNOSIS — O479 False labor, unspecified: Secondary | ICD-10-CM

## 2014-08-25 DIAGNOSIS — E860 Dehydration: Secondary | ICD-10-CM

## 2014-08-25 DIAGNOSIS — Z3A37 37 weeks gestation of pregnancy: Secondary | ICD-10-CM

## 2014-08-25 DIAGNOSIS — J069 Acute upper respiratory infection, unspecified: Secondary | ICD-10-CM | POA: Diagnosis not present

## 2014-08-25 DIAGNOSIS — Z3490 Encounter for supervision of normal pregnancy, unspecified, unspecified trimester: Secondary | ICD-10-CM

## 2014-08-25 LAB — COMPREHENSIVE METABOLIC PANEL
ALT: 14 U/L (ref 0–35)
AST: 17 U/L (ref 0–37)
Albumin: 2.8 g/dL — ABNORMAL LOW (ref 3.5–5.2)
Alkaline Phosphatase: 133 U/L — ABNORMAL HIGH (ref 39–117)
Anion gap: 5 (ref 5–15)
BUN: 6 mg/dL (ref 6–23)
CO2: 22 mmol/L (ref 19–32)
Calcium: 9 mg/dL (ref 8.4–10.5)
Chloride: 110 mmol/L (ref 96–112)
Creatinine, Ser: 0.4 mg/dL — ABNORMAL LOW (ref 0.50–1.10)
GFR calc Af Amer: >90 mL/min (ref >90)
GFR calc non Af Amer: >90 mL/min (ref >90)
Glucose, Bld: 110 mg/dL — ABNORMAL HIGH (ref 70–99)
Potassium: 3.3 mmol/L — ABNORMAL LOW (ref 3.5–5.1)
Sodium: 137 mmol/L (ref 135–145)
Total Bilirubin: 0.3 mg/dL (ref 0.3–1.2)
Total Protein: 6.4 g/dL (ref 6.0–8.3)

## 2014-08-25 LAB — URINALYSIS, ROUTINE W REFLEX MICROSCOPIC
Bilirubin Urine: NEGATIVE
Glucose, UA: NEGATIVE mg/dL
Hgb urine dipstick: NEGATIVE
Ketones, ur: NEGATIVE mg/dL
Leukocytes, UA: NEGATIVE
Nitrite: NEGATIVE
Protein, ur: NEGATIVE mg/dL
Specific Gravity, Urine: >1.03 — ABNORMAL HIGH (ref 1.005–1.030)
Urobilinogen, UA: 0.2 mg/dL (ref 0.0–1.0)
pH: 6.5 (ref 5.0–8.0)

## 2014-08-25 LAB — RAPID URINE DRUG SCREEN, HOSP PERFORMED
Amphetamines: NOT DETECTED
Barbiturates: NOT DETECTED
Benzodiazepines: NOT DETECTED
Cocaine: NOT DETECTED
Opiates: NOT DETECTED
Tetrahydrocannabinol: NOT DETECTED

## 2014-08-25 LAB — PROTEIN / CREATININE RATIO, URINE
Creatinine, Urine: 129 mg/dL
Protein Creatinine Ratio: 0.17 — ABNORMAL HIGH (ref 0.00–0.15)
Total Protein, Urine: 22 mg/dL

## 2014-08-25 LAB — CBC
HCT: 36.4 % (ref 36.0–46.0)
Hemoglobin: 12.1 g/dL (ref 12.0–15.0)
MCH: 30.6 pg (ref 26.0–34.0)
MCHC: 33.2 g/dL (ref 30.0–36.0)
MCV: 92.2 fL (ref 78.0–100.0)
Platelets: 267 10*3/uL (ref 150–400)
RBC: 3.95 MIL/uL (ref 3.87–5.11)
RDW: 14 % (ref 11.5–15.5)
WBC: 14.3 10*3/uL — ABNORMAL HIGH (ref 4.0–10.5)

## 2014-08-25 MED ORDER — LACTATED RINGERS IV BOLUS (SEPSIS)
1000.0000 mL | Freq: Once | INTRAVENOUS | Status: AC
  Administered 2014-08-25: 1000 mL via INTRAVENOUS

## 2014-08-25 MED ORDER — ACETAMINOPHEN 500 MG PO TABS
1000.0000 mg | ORAL_TABLET | Freq: Once | ORAL | Status: AC
  Administered 2014-08-25: 1000 mg via ORAL
  Filled 2014-08-25: qty 2

## 2014-08-25 NOTE — Progress Notes (Signed)
Routine visit. Good FM. She has a bad headache, visual changes/"spots", and worsening edema. 1+ protein on dip. Rec visit MAU for labs. NPO.

## 2014-08-25 NOTE — Discharge Instructions (Signed)
Rehydration, Adult °Rehydration is the replacement of body fluids lost during dehydration. Dehydration is an extreme loss of body fluids to the point of body function impairment. There are many ways extreme fluid loss can occur, including vomiting, diarrhea, or excess sweating. Recovering from dehydration requires replacing lost fluids, continuing to eat to maintain strength, and avoiding foods and beverages that may contribute to further fluid loss or may increase nausea. °HOW TO REHYDRATE °In most cases, rehydration involves the replacement of not only fluids but also carbohydrates and basic body salts. Rehydration with an oral rehydration solution is one way to replace essential nutrients lost through dehydration. °An oral rehydration solution can be purchased at pharmacies, retail stores, and online. Premixed packets of powder that you combine with water to make a solution are also sold. You can prepare an oral rehydration solution at home by mixing the following ingredients together:  °·  - tsp table salt. °· ¾ tsp baking soda. °·  tsp salt substitute containing potassium chloride. °· 1 tablespoons sugar. °· 1 L (34 oz) of water. °Be sure to use exact measurements. Including too much sugar can make diarrhea worse. °Drink ½-1 cup (120-240 mL) of oral rehydration solution each time you have diarrhea or vomit. If drinking this amount makes your vomiting worse, try drinking smaller amounts more often. For example, drink 1-3 tsp every 5-10 minutes.  °A general rule for staying hydrated is to drink 1½-2 L of fluid per day. Talk to your caregiver about the specific amount you should be drinking each day. Drink enough fluids to keep your urine clear or pale yellow. °EATING WHEN DEHYDRATED °Even if you have had severe sweating or you are having diarrhea, do not stop eating. Many healthy items in a normal diet are okay to continue eating while recovering from dehydration. The following tips can help you to lessen nausea  when you eat: °· Ask someone else to prepare your food. Cooking smells may worsen nausea. °· Eat in a well-ventilated room away from cooking smells. °· Sit up when you eat. Avoid lying down until 1-2 hours after eating. °· Eat small amounts when you eat. °· Eat foods that are easy to digest. These include soft, well-cooked, or mashed foods. °FOODS AND BEVERAGES TO AVOID °Avoid eating or drinking the following foods and beverages that may increase nausea or further loss of fluid:  °· Fruit juices with a high sugar content, such as concentrated juices. °· Alcohol. °· Beverages containing caffeine. °· Carbonated drinks. They may cause a lot of gas. °· Foods that may cause a lot of gas, such as cabbage, broccoli, and beans. °· Fatty, greasy, and fried foods. °· Spicy, very salty, and very sweet foods or drinks. °· Foods or drinks that are very hot or very cold. Consume food or drinks at or near room temperature. °· Foods that need a lot of chewing, such as raw vegetables. °· Foods that are sticky or hard to swallow, such as peanut butter. °Document Released: 09/05/2011 Document Revised: 03/07/2012 Document Reviewed: 09/05/2011 °ExitCare® Patient Information ©2015 [employer]. This information is not intended to replace advice given to you by your health care provider. Make sure you discuss any questions you have with your health care provider. ° °

## 2014-08-25 NOTE — MAU Note (Signed)
BP has been steadily rising past few wks.  Sent from office to day for further eval.  Slight headache, also having sinus issues.  Seeing spots for the past 2-3 wks, increased edema past 3 wks;denies epigastric pain.

## 2014-08-25 NOTE — MAU Provider Note (Signed)
History     CSN: [id number]  Arrival date and time: 08/25/14 1511   First Provider Initiated Contact with Patient 08/25/14 1636      Chief Complaint  Patient presents with  . Hypertension   HPI  Virginia Sanders is a 24 y.o. G4P1021 at [redacted]w[redacted]d who presents to the MAU from clinic for evaluation of elevated blood pressure. She was in her usual state of health until a few weeks ago when she developed symptoms of an upper respiratory infection. She has continued to have sinus headache, congestion, rhinorrhea and general malaise since that time. Denies fevers, chills, chest pain, shortness of breath, nausea, vomiting, diarrhea, or other concerning symptoms. She presented to clinic today for routine OB visit and blood pressure was slightly elevated at 141/91 so she was sent to the MAU for further evaluation. At the time of evaluation she continued to complain of mild sinus headache, congestion, rhinorrhea, general malaise. Denies shortness of breath, RUQ pain, or other concerning symptoms.   +Fetal movement. Denies loss of fluid, vaginal bleeding/discharge. Endorses some mild irregular uterine contractions.   OB History    Gravida Para Term Preterm AB TAB SAB Ectopic Multiple Living   Past Medical History  Diagnosis Date  . Depression   . Anxiety   . Bipolar 1 disorder   . Abnormal Pap smear of cervix     Colpo    Past Surgical History  Procedure Laterality Date  . Bladder repair w/ cesarean section    . Wisdom tooth extraction    . Cesarean section      Family History  Problem Relation Age of Onset  . Diabetes Maternal Grandmother   . Diabetes Maternal Grandfather   . Hypertension Father   . Hypertension Maternal Grandfather   . Heart attack Paternal Grandmother   . Heart murmur Brother     History  Substance Use Topics  . Smoking status: Former Smoker -- 0.30 packs/day for 4 years    Types: Cigarettes    Quit date: 01/21/2014  . Smokeless tobacco:  Never Used  . Alcohol Use: No    Allergies:  Allergies  Allergen Reactions  . Buspirone Other (See Comments)    Jittery  . Sulfa Antibiotics Hives    Prescriptions prior to admission  Medication Sig Dispense Refill Last Dose  . calcium carbonate (TUMS - DOSED IN MG ELEMENTAL CALCIUM) 500 MG chewable tablet Chew 2 tablets by mouth 2 (two) times daily as needed for indigestion or heartburn.   08/24/2014 at Unknown time  . Doxylamine-Pyridoxine (DICLEGIS) 10-10 MG TBEC Take 2 tablets by mouth as directed. 100 tablet 1 Past Month at Unknown time  . escitalopram (LEXAPRO) 10 MG tablet Take 10 mg by mouth daily.   2 08/24/2014 at Unknown time  . lamoTRIgine (LAMICTAL) 150 MG tablet Take 300 mg by mouth daily.   08/24/2014 at Unknown time  . omeprazole (PRILOSEC OTC) 20 MG tablet Take 20 mg by mouth daily as needed (For heartburn.).   Past Week at Unknown time  . Prenatal Vit-Fe Fumarate-FA (MULTIVITAMIN-PRENATAL) 27-0.8 MG TABS tablet Take 1 tablet by mouth daily at 12 noon. 30 each 6 08/24/2014 at Unknown time  . promethazine (PHENERGAN) 25 MG tablet Take 1 tablet (25 mg total) by mouth every 6 (six) hours as needed for nausea or vomiting. 30 tablet 1 Past Month at Unknown time  . risperiDONE (RISPERDAL) 1 MG tablet  Take 1 mg by mouth daily.    08/24/2014 at Unknown time    Review of Systems  Constitutional: Positive for malaise/fatigue. Negative for fever and chills.  HENT: Positive for congestion. Negative for sore throat.   Eyes: Negative.  Negative for double vision and photophobia.  Respiratory: Negative.  Negative for cough, shortness of breath and wheezing.   Cardiovascular: Negative.  Negative for chest pain and leg swelling.  Gastrointestinal: Negative.  Negative for nausea, vomiting, abdominal pain, diarrhea and constipation.  Genitourinary: Negative.  Negative for dysuria, urgency, frequency and hematuria.  Musculoskeletal: Negative.  Negative for myalgias.  Skin: Negative.    Neurological: Positive for headaches (sinus). Negative for weakness.  Psychiatric/Behavioral: Negative.   All other systems reviewed and are negative.  Physical Exam   Blood pressure 123/78, pulse 105, temperature 98.3 F (36.8 C), temperature source Oral, resp. rate 18, last menstrual period 11/28/2013.    Physical Exam  Nursing note and vitals reviewed. Constitutional: She is oriented to person, place, and time. She appears well-developed and well-nourished. No distress.  HENT:  Head: Normocephalic and atraumatic.  Cardiovascular: Regular rhythm, normal heart sounds and intact distal pulses.  Tachycardia present.   Respiratory: Effort normal and breath sounds normal. No respiratory distress. She has no wheezes. She has no rales.  GI: Soft. Bowel sounds are normal. She exhibits no distension. There is no tenderness. There is no rebound and no guarding.  Genitourinary: Vagina normal. No vaginal discharge found.  SVE: 1/50/-3, unchanged on repeat examination  Neurological: She is alert and oriented to person, place, and time.  Skin: Skin is warm and dry.  Psychiatric: She has a normal mood and affect. Her behavior is normal.    MAU Course  Procedures  MDM NST reactive Toco regular every 2-3 minutes, spaced after IVF 1L LR bolus 1000 mg Tylenol   CBC    Component Value Date/Time   WBC 14.3* 08/25/2014 1538   RBC 3.95 08/25/2014 1538   HGB 12.1 08/25/2014 1538   HCT 36.4 08/25/2014 1538   PLT 267 08/25/2014 1538   MCV 92.2 08/25/2014 1538   MCH 30.6 08/25/2014 1538   MCHC 33.2 08/25/2014 1538   RDW 14.0 08/25/2014 1538   LYMPHSABS 2.9 02/24/2014 1111   MONOABS 0.6 02/24/2014 1111   EOSABS 0.1 02/24/2014 1111   BASOSABS 0.0 02/24/2014 1111    CMP     Component Value Date/Time   NA 137 08/25/2014 1538   K 3.3* 08/25/2014 1538   CL 110 08/25/2014 1538   CO2 22 08/25/2014 1538   GLUCOSE 110* 08/25/2014 1538   BUN 6 08/25/2014 1538   CREATININE 0.40* 08/25/2014  1538   CREATININE 0.51 03/07/2014 1023   CREATININE 0.51 03/07/2014 1023   CALCIUM 9.0 08/25/2014 1538   PROT 6.4 08/25/2014 1538   ALBUMIN 2.8* 08/25/2014 1538   AST 17 08/25/2014 1538   ALT 14 08/25/2014 1538   ALKPHOS 133* 08/25/2014 1538   BILITOT 0.3 08/25/2014 1538   GFRNONAA >90 08/25/2014 1538   GFRAA >90 08/25/2014 1538    UPC 0.17  Assessment and Plan  Ann-Marie Kluge is a 24 y.o. G4P1021 at [redacted]w[redacted]d who presents to the MAU for rule out pre-eclampsia.   #Elevated blood pressure, resolved Blood pressure initially elevated in clinic and on arrival. Monitored blood pressure for several hours and ultimately normal range. She did not have two blood pressures elevated > 140/90 more than 4 hours apart to warrant diagnosis of gestational hypertension at that  time. Headache more concerning for sinus headache and unlikely CNS manifestation.  - HELLP neg - UPC 0.017. Mildly elevated. Will send home with supplies for 24 hour urine collection.  - Will need blood pressure check in clinic this week. Ideally Wednesday. Instructed patient to call clinic in the morning to schedule.   #Upper Respiratory Infection Patient mildly dehydrated on examination with symptoms of upper respiratory infection.  - 1L LR bolus with improvement in symptoms and tachycardia.  - Supportive management.   #Contractions - Mildly painful and regular.  - No change in cervix over several hours.  - No evidence of labor.  - Labor precautions given.   Follow up in clinic for repeat blood pressure check and 24 hour urine within 1-2 days.   Patient and plan discussed with attending physician, Dr. Jolayne Pantheronstant, who reviewed chart and blood pressures and was in agreement with plan.   William DaltonMcEachern, Anjoli Diemer 08/25/2014, 9:30 PM

## 2014-08-25 NOTE — MAU Note (Signed)
24 hour urine collection container given to patient and instructions for collection verbally given. Patient verbalizes understanding and [facility] Wednesday after 24 hours of collection.

## 2014-08-26 ENCOUNTER — Encounter: Payer: Self-pay | Admitting: *Deleted

## 2014-08-27 ENCOUNTER — Ambulatory Visit (INDEPENDENT_AMBULATORY_CARE_PROVIDER_SITE_OTHER): Payer: Medicaid Other | Admitting: Obstetrics & Gynecology

## 2014-08-27 VITALS — BP 138/85 | HR 102 | Wt 248.0 lb

## 2014-08-27 DIAGNOSIS — Z3483 Encounter for supervision of other normal pregnancy, third trimester: Secondary | ICD-10-CM

## 2014-08-27 DIAGNOSIS — O133 Gestational [pregnancy-induced] hypertension without significant proteinuria, third trimester: Secondary | ICD-10-CM

## 2014-08-27 DIAGNOSIS — J019 Acute sinusitis, unspecified: Secondary | ICD-10-CM

## 2014-08-27 MED ORDER — CEPHALEXIN 500 MG PO CAPS: 500.0000 mg | ORAL_CAPSULE | Freq: Four times a day (QID) | ORAL | Status: DC

## 2014-08-27 NOTE — Progress Notes (Signed)
She is here for a follow up after MAU visit. She feels better overall but still has sinus infection symptoms. I will prescribe keflex for 10 days, QID. She has good FM and denies any OB problems.

## 2014-08-27 NOTE — Progress Notes (Signed)
Turning in a 24 hour Urine today

## 2014-09-01 ENCOUNTER — Ambulatory Visit (INDEPENDENT_AMBULATORY_CARE_PROVIDER_SITE_OTHER): Payer: Medicaid Other | Admitting: Advanced Practice Midwife

## 2014-09-01 VITALS — BP 128/92 | HR 102 | Wt 248.0 lb

## 2014-09-01 DIAGNOSIS — O133 Gestational [pregnancy-induced] hypertension without significant proteinuria, third trimester: Secondary | ICD-10-CM

## 2014-09-01 DIAGNOSIS — Z3493 Encounter for supervision of normal pregnancy, unspecified, third trimester: Secondary | ICD-10-CM

## 2014-09-01 NOTE — Progress Notes (Signed)
BP elevated for second time today. Meets criteria for GHTN.  Has had some on and off mild-moderate HAs for several weeks. Doubt R/T BP. Hasn't needed to take anythign for them. None now. Denies vision changes or epigastric pain. Per consult w/ Dr. Debroah LoopArnold will do NST and labs today. NST reactive. Do not need to move up C/S if labs, NST normal. F/U in 3 days for NST, AFI, BP check. C/S scheduled for 3/14.

## 2014-09-01 NOTE — Patient Instructions (Signed)

## 2014-09-02 ENCOUNTER — Encounter (HOSPITAL_COMMUNITY): Payer: Self-pay | Admitting: Anesthesiology

## 2014-09-02 ENCOUNTER — Inpatient Hospital Stay (HOSPITAL_COMMUNITY): Payer: Medicaid Other | Admitting: Anesthesiology

## 2014-09-02 ENCOUNTER — Inpatient Hospital Stay (HOSPITAL_COMMUNITY)
Admission: AD | Admit: 2014-09-02 | Discharge: 2014-09-04 | DRG: 765 | Disposition: A | Discharge status: Home / Self Care | Payer: Medicaid Other | Source: Ambulatory Visit | Attending: Family Medicine | Admitting: Family Medicine

## 2014-09-02 ENCOUNTER — Ambulatory Visit (INDEPENDENT_AMBULATORY_CARE_PROVIDER_SITE_OTHER): Payer: Medicaid Other | Admitting: Obstetrics & Gynecology

## 2014-09-02 ENCOUNTER — Encounter (HOSPITAL_COMMUNITY)
Admission: AD | Disposition: A | Discharge status: Home / Self Care | Payer: Self-pay | Source: Ambulatory Visit | Attending: Family Medicine

## 2014-09-02 VITALS — BP 137/92 | HR 98 | Wt 249.0 lb

## 2014-09-02 DIAGNOSIS — O99324 Drug use complicating childbirth: Secondary | ICD-10-CM | POA: Diagnosis present

## 2014-09-02 DIAGNOSIS — Z6841 Body Mass Index (BMI) 40.0 and over, adult: Secondary | ICD-10-CM | POA: Diagnosis not present

## 2014-09-02 DIAGNOSIS — F329 Major depressive disorder, single episode, unspecified: Secondary | ICD-10-CM | POA: Diagnosis present

## 2014-09-02 DIAGNOSIS — Z833 Family history of diabetes mellitus: Secondary | ICD-10-CM

## 2014-09-02 DIAGNOSIS — O3421 Maternal care for scar from previous cesarean delivery: Principal | ICD-10-CM | POA: Diagnosis present

## 2014-09-02 DIAGNOSIS — Z302 Encounter for sterilization: Secondary | ICD-10-CM

## 2014-09-02 DIAGNOSIS — Z3A38 38 weeks gestation of pregnancy: Secondary | ICD-10-CM | POA: Diagnosis present

## 2014-09-02 DIAGNOSIS — O99344 Other mental disorders complicating childbirth: Secondary | ICD-10-CM | POA: Diagnosis present

## 2014-09-02 DIAGNOSIS — Z3483 Encounter for supervision of other normal pregnancy, third trimester: Secondary | ICD-10-CM | POA: Diagnosis present

## 2014-09-02 DIAGNOSIS — O99214 Obesity complicating childbirth: Secondary | ICD-10-CM | POA: Diagnosis present

## 2014-09-02 DIAGNOSIS — Z8249 Family history of ischemic heart disease and other diseases of the circulatory system: Secondary | ICD-10-CM | POA: Diagnosis not present

## 2014-09-02 DIAGNOSIS — O34219 Maternal care for unspecified type scar from previous cesarean delivery: Secondary | ICD-10-CM

## 2014-09-02 DIAGNOSIS — O1413 Severe pre-eclampsia, third trimester: Secondary | ICD-10-CM | POA: Diagnosis present

## 2014-09-02 DIAGNOSIS — Z87891 Personal history of nicotine dependence: Secondary | ICD-10-CM | POA: Diagnosis not present

## 2014-09-02 DIAGNOSIS — O139 Gestational [pregnancy-induced] hypertension without significant proteinuria, unspecified trimester: Secondary | ICD-10-CM

## 2014-09-02 DIAGNOSIS — Z9851 Tubal ligation status: Secondary | ICD-10-CM

## 2014-09-02 DIAGNOSIS — F1290 Cannabis use, unspecified, uncomplicated: Secondary | ICD-10-CM | POA: Diagnosis present

## 2014-09-02 LAB — CREATININE CLEARANCE, URINE, 24 HOUR
Creatinine Clearance: 275 mL/min — ABNORMAL HIGH (ref 75–115)
Creatinine, 24H Ur: 1467 mg/d (ref 700–1800)
Creatinine, Urine: 81.5 mg/dL
Creatinine: 0.37 mg/dL — ABNORMAL LOW (ref 0.50–1.10)

## 2014-09-02 LAB — COMPREHENSIVE METABOLIC PANEL
ALT: 9 U/L (ref 0–35)
AST: 14 U/L (ref 0–37)
Albumin: 2.9 g/dL — ABNORMAL LOW (ref 3.5–5.2)
Alkaline Phosphatase: 143 U/L — ABNORMAL HIGH (ref 39–117)
BUN: 4 mg/dL — ABNORMAL LOW (ref 6–23)
CO2: 18 mEq/L — ABNORMAL LOW (ref 19–32)
Calcium: 8.4 mg/dL (ref 8.4–10.5)
Chloride: 106 mEq/L (ref 96–112)
Creat: 0.37 mg/dL — ABNORMAL LOW (ref 0.50–1.10)
Glucose, Bld: 70 mg/dL (ref 70–99)
Potassium: 3.3 mEq/L — ABNORMAL LOW (ref 3.5–5.3)
Sodium: 140 mEq/L (ref 135–145)
Total Bilirubin: 0.3 mg/dL (ref 0.2–1.2)
Total Protein: 5.4 g/dL — ABNORMAL LOW (ref 6.0–8.3)

## 2014-09-02 LAB — CBC
HCT: 34.6 % — ABNORMAL LOW (ref 36.0–46.0)
HCT: 36.6 % (ref 36.0–46.0)
Hemoglobin: 11.6 g/dL — ABNORMAL LOW (ref 12.0–15.0)
Hemoglobin: 12.1 g/dL (ref 12.0–15.0)
MCH: 29.9 pg (ref 26.0–34.0)
MCH: 30.6 pg (ref 26.0–34.0)
MCHC: 33.5 g/dL (ref 30.0–36.0)
MCHC: 33.1 g/dL (ref 30.0–36.0)
MCV: 89.2 fL (ref 78.0–100.0)
MCV: 92.4 fL (ref 78.0–100.0)
MPV: 10.3 fL (ref 8.6–12.4)
Platelets: 271 10*3/uL (ref 150–400)
Platelets: 255 10*3/uL (ref 150–400)
RBC: 3.88 MIL/uL (ref 3.87–5.11)
RBC: 3.96 MIL/uL (ref 3.87–5.11)
RDW: 14.3 % (ref 11.5–15.5)
RDW: 13.9 % (ref 11.5–15.5)
WBC: 9.9 10*3/uL (ref 4.0–10.5)
WBC: 11.7 10*3/uL — ABNORMAL HIGH (ref 4.0–10.5)

## 2014-09-02 LAB — PROTEIN / CREATININE RATIO, URINE
Creatinine, Urine: 165.9 mg/dL
Protein Creatinine Ratio: 0.2 — ABNORMAL HIGH (ref <0.15)
Total Protein, Urine: 33 mg/dL — ABNORMAL HIGH (ref 5–24)

## 2014-09-02 LAB — RAPID URINE DRUG SCREEN, HOSP PERFORMED
Amphetamines: NOT DETECTED
Barbiturates: NOT DETECTED
Benzodiazepines: NOT DETECTED
Cocaine: NOT DETECTED
Opiates: NOT DETECTED
Tetrahydrocannabinol: NOT DETECTED

## 2014-09-02 LAB — TYPE AND SCREEN
ABO/RH(D): O POS
Antibody Screen: NEGATIVE

## 2014-09-02 LAB — PROTEIN, URINE, 24 HOUR
Protein, 24H Urine: 252 mg/d — ABNORMAL HIGH (ref <150)
Protein, Urine: 14 mg/dL (ref 5–24)

## 2014-09-02 LAB — ABO/RH: ABO/RH(D): O POS

## 2014-09-02 LAB — GLUCOSE, CAPILLARY: Glucose-Capillary: 86 mg/dL (ref 70–99)

## 2014-09-02 SURGERY — Surgical Case
Anesthesia: Spinal

## 2014-09-02 MED ORDER — PHENYLEPHRINE HCL 10 MG/ML IJ SOLN
INTRAMUSCULAR | Status: DC | PRN
  Administered 2014-09-02: 80 ug via INTRAVENOUS
  Administered 2014-09-02 (×3): 40 ug via INTRAVENOUS
  Administered 2014-09-02: 80 ug via INTRAVENOUS

## 2014-09-02 MED ORDER — DIBUCAINE 1 % RE OINT: 1.0000 "application " | TOPICAL_OINTMENT | RECTAL | Status: DC | PRN

## 2014-09-02 MED ORDER — FAMOTIDINE IN NACL 20-0.9 MG/50ML-% IV SOLN
20.0000 mg | Freq: Once | INTRAVENOUS | Status: AC
  Administered 2014-09-02: 20 mg via INTRAVENOUS
  Filled 2014-09-02: qty 50

## 2014-09-02 MED ORDER — WITCH HAZEL-GLYCERIN EX PADS: 1.0000 "application " | MEDICATED_PAD | CUTANEOUS | Status: DC | PRN

## 2014-09-02 MED ORDER — OXYCODONE-ACETAMINOPHEN 5-325 MG PO TABS
2.0000 | ORAL_TABLET | ORAL | Status: DC | PRN
  Administered 2014-09-03 – 2014-09-04 (×3): 2 via ORAL
  Filled 2014-09-02 (×3): qty 2

## 2014-09-02 MED ORDER — OXYTOCIN 40 UNITS IN LACTATED RINGERS INFUSION - SIMPLE MED: 62.5000 mL/h | INTRAVENOUS | Status: AC

## 2014-09-02 MED ORDER — NALOXONE HCL 1 MG/ML IJ SOLN: 1.0000 ug/kg/h | INTRAVENOUS | Status: DC | PRN

## 2014-09-02 MED ORDER — ONDANSETRON HCL 4 MG PO TABS: 4.0000 mg | ORAL_TABLET | ORAL | Status: DC | PRN

## 2014-09-02 MED ORDER — KETOROLAC TROMETHAMINE 30 MG/ML IJ SOLN: 30.0000 mg | Freq: Four times a day (QID) | INTRAMUSCULAR | Status: AC | PRN

## 2014-09-02 MED ORDER — MAGNESIUM SULFATE BOLUS VIA INFUSION
4.0000 g | Freq: Once | INTRAVENOUS | Status: AC
  Administered 2014-09-02: 4 g/h via INTRAVENOUS
  Filled 2014-09-02: qty 500

## 2014-09-02 MED ORDER — DIPHENHYDRAMINE HCL 25 MG PO CAPS
25.0000 mg | ORAL_CAPSULE | ORAL | Status: DC | PRN
  Administered 2014-09-03: 25 mg via ORAL
  Filled 2014-09-02: qty 1

## 2014-09-02 MED ORDER — ZOLPIDEM TARTRATE 5 MG PO TABS: 5.0000 mg | ORAL_TABLET | Freq: Every evening | ORAL | Status: DC | PRN

## 2014-09-02 MED ORDER — NALOXONE HCL 0.4 MG/ML IJ SOLN: 0.4000 mg | INTRAMUSCULAR | Status: DC | PRN

## 2014-09-02 MED ORDER — LANOLIN HYDROUS EX OINT: 1.0000 "application " | TOPICAL_OINTMENT | CUTANEOUS | Status: DC | PRN

## 2014-09-02 MED ORDER — ONDANSETRON HCL 4 MG/2ML IJ SOLN
INTRAMUSCULAR | Status: DC | PRN
  Administered 2014-09-02: 4 mg via INTRAVENOUS

## 2014-09-02 MED ORDER — SIMETHICONE 80 MG PO CHEW
80.0000 mg | CHEWABLE_TABLET | ORAL | Status: DC
  Administered 2014-09-02 – 2014-09-03 (×2): 80 mg via ORAL
  Filled 2014-09-02 (×2): qty 1

## 2014-09-02 MED ORDER — ONDANSETRON HCL 4 MG/2ML IJ SOLN: 4.0000 mg | Freq: Three times a day (TID) | INTRAMUSCULAR | Status: DC | PRN

## 2014-09-02 MED ORDER — LACTATED RINGERS IV SOLN
INTRAVENOUS | Status: DC
  Administered 2014-09-02: 19:00:00 via INTRAVENOUS

## 2014-09-02 MED ORDER — PHENYLEPHRINE 8 MG IN D5W 100 ML (0.08MG/ML) PREMIX OPTIME
INJECTION | INTRAVENOUS | Status: AC
  Filled 2014-09-02: qty 100

## 2014-09-02 MED ORDER — PHENYLEPHRINE 8 MG IN D5W 100 ML (0.08MG/ML) PREMIX OPTIME
INJECTION | INTRAVENOUS | Status: DC | PRN
  Administered 2014-09-02: 60 ug/min via INTRAVENOUS

## 2014-09-02 MED ORDER — MENTHOL 3 MG MT LOZG: 1.0000 | LOZENGE | OROMUCOSAL | Status: DC | PRN

## 2014-09-02 MED ORDER — SCOPOLAMINE 1 MG/3DAYS TD PT72
MEDICATED_PATCH | TRANSDERMAL | Status: DC | PRN
  Administered 2014-09-02: 1 via TRANSDERMAL

## 2014-09-02 MED ORDER — NALBUPHINE HCL 10 MG/ML IJ SOLN: 5.0000 mg | INTRAMUSCULAR | Status: DC | PRN

## 2014-09-02 MED ORDER — BUPIVACAINE HCL (PF) 0.25 % IJ SOLN
INTRAMUSCULAR | Status: AC
  Filled 2014-09-02: qty 10

## 2014-09-02 MED ORDER — LACTATED RINGERS IV SOLN
40.0000 [IU] | INTRAVENOUS | Status: DC | PRN
  Administered 2014-09-02: 40 [IU] via INTRAVENOUS

## 2014-09-02 MED ORDER — DIPHENHYDRAMINE HCL 25 MG PO CAPS: 25.0000 mg | ORAL_CAPSULE | Freq: Four times a day (QID) | ORAL | Status: DC | PRN

## 2014-09-02 MED ORDER — PHENYLEPHRINE 40 MCG/ML (10ML) SYRINGE FOR IV PUSH (FOR BLOOD PRESSURE SUPPORT)
PREFILLED_SYRINGE | INTRAVENOUS | Status: AC
  Filled 2014-09-02: qty 10

## 2014-09-02 MED ORDER — PHENYLEPHRINE 8 MG IN D5W 100 ML (0.08MG/ML) PREMIX OPTIME: INJECTION | INTRAVENOUS | Status: DC | PRN

## 2014-09-02 MED ORDER — SODIUM CHLORIDE 0.9 % IJ SOLN: 3.0000 mL | INTRAMUSCULAR | Status: DC | PRN

## 2014-09-02 MED ORDER — TETANUS-DIPHTH-ACELL PERTUSSIS 5-2.5-18.5 LF-MCG/0.5 IM SUSP
0.5000 mL | Freq: Once | INTRAMUSCULAR | Status: DC
  Filled 2014-09-02: qty 0.5

## 2014-09-02 MED ORDER — IBUPROFEN 600 MG PO TABS: 600.0000 mg | ORAL_TABLET | Freq: Four times a day (QID) | ORAL | Status: DC | PRN

## 2014-09-02 MED ORDER — LACTATED RINGERS IV BOLUS (SEPSIS)
1000.0000 mL | Freq: Once | INTRAVENOUS | Status: AC
  Administered 2014-09-02: 1000 mL via INTRAVENOUS

## 2014-09-02 MED ORDER — IBUPROFEN 600 MG PO TABS
600.0000 mg | ORAL_TABLET | Freq: Four times a day (QID) | ORAL | Status: DC
  Administered 2014-09-02 – 2014-09-04 (×7): 600 mg via ORAL
  Filled 2014-09-02 (×7): qty 1

## 2014-09-02 MED ORDER — CEFAZOLIN SODIUM-DEXTROSE 2-3 GM-% IV SOLR: 2.0000 g | INTRAVENOUS | Status: DC

## 2014-09-02 MED ORDER — SIMETHICONE 80 MG PO CHEW: 80.0000 mg | CHEWABLE_TABLET | ORAL | Status: DC | PRN

## 2014-09-02 MED ORDER — FENTANYL CITRATE 0.05 MG/ML IJ SOLN
INTRAMUSCULAR | Status: DC | PRN
  Administered 2014-09-02: 12.5 ug via INTRATHECAL

## 2014-09-02 MED ORDER — MORPHINE SULFATE (PF) 0.5 MG/ML IJ SOLN
INTRAMUSCULAR | Status: DC | PRN
  Administered 2014-09-02: .2 mg via INTRATHECAL

## 2014-09-02 MED ORDER — NALBUPHINE HCL 10 MG/ML IJ SOLN
5.0000 mg | INTRAMUSCULAR | Status: DC | PRN
  Administered 2014-09-03 (×2): 5 mg via INTRAVENOUS
  Filled 2014-09-02 (×2): qty 1

## 2014-09-02 MED ORDER — DIPHENHYDRAMINE HCL 50 MG/ML IJ SOLN: 12.5000 mg | INTRAMUSCULAR | Status: DC | PRN

## 2014-09-02 MED ORDER — ONDANSETRON HCL 4 MG/2ML IJ SOLN
INTRAMUSCULAR | Status: AC
  Filled 2014-09-02: qty 2

## 2014-09-02 MED ORDER — PROMETHAZINE HCL 25 MG/ML IJ SOLN: 6.2500 mg | INTRAMUSCULAR | Status: DC | PRN

## 2014-09-02 MED ORDER — OXYTOCIN 10 UNIT/ML IJ SOLN
INTRAMUSCULAR | Status: AC
  Filled 2014-09-02: qty 4

## 2014-09-02 MED ORDER — MAGNESIUM SULFATE 40 G IN LACTATED RINGERS - SIMPLE
2.0000 g/h | INTRAVENOUS | Status: AC
  Administered 2014-09-02 (×2): 2 g/h via INTRAVENOUS
  Administered 2014-09-02: 4 g/h via INTRAVENOUS
  Filled 2014-09-02 (×2): qty 500

## 2014-09-02 MED ORDER — HYDROMORPHONE HCL 1 MG/ML IJ SOLN: 0.2500 mg | INTRAMUSCULAR | Status: DC | PRN

## 2014-09-02 MED ORDER — SENNOSIDES-DOCUSATE SODIUM 8.6-50 MG PO TABS
2.0000 | ORAL_TABLET | ORAL | Status: DC
  Administered 2014-09-02 – 2014-09-03 (×2): 2 via ORAL
  Filled 2014-09-02 (×2): qty 2

## 2014-09-02 MED ORDER — MEPERIDINE HCL 25 MG/ML IJ SOLN: 6.2500 mg | INTRAMUSCULAR | Status: DC | PRN

## 2014-09-02 MED ORDER — SCOPOLAMINE 1 MG/3DAYS TD PT72
MEDICATED_PATCH | TRANSDERMAL | Status: AC
  Filled 2014-09-02: qty 1

## 2014-09-02 MED ORDER — PRENATAL MULTIVITAMIN CH
1.0000 | ORAL_TABLET | Freq: Every day | ORAL | Status: DC
  Administered 2014-09-03 – 2014-09-04 (×2): 1 via ORAL
  Filled 2014-09-02 (×2): qty 1

## 2014-09-02 MED ORDER — LACTATED RINGERS IV SOLN
INTRAVENOUS | Status: DC | PRN
  Administered 2014-09-02 (×2): via INTRAVENOUS

## 2014-09-02 MED ORDER — CITRIC ACID-SODIUM CITRATE 334-500 MG/5ML PO SOLN
30.0000 mL | Freq: Once | ORAL | Status: AC
  Administered 2014-09-02: 30 mL via ORAL
  Filled 2014-09-02: qty 15

## 2014-09-02 MED ORDER — MEPERIDINE HCL 25 MG/ML IJ SOLN
6.2500 mg | INTRAMUSCULAR | Status: DC | PRN
Start: 2014-09-02 — End: 2014-09-02

## 2014-09-02 MED ORDER — LACTATED RINGERS IV SOLN: INTRAVENOUS | Status: DC

## 2014-09-02 MED ORDER — BUPIVACAINE HCL (PF) 0.25 % IJ SOLN
INTRAMUSCULAR | Status: DC | PRN
  Administered 2014-09-02: 30 mL

## 2014-09-02 MED ORDER — CEFAZOLIN SODIUM-DEXTROSE 2-3 GM-% IV SOLR
2.0000 g | INTRAVENOUS | Status: AC
  Administered 2014-09-02: 2 g via INTRAVENOUS
  Filled 2014-09-02: qty 50

## 2014-09-02 MED ORDER — BUPIVACAINE IN DEXTROSE 0.75-8.25 % IT SOLN
INTRATHECAL | Status: DC | PRN
  Administered 2014-09-02: 1.6 mL via INTRATHECAL

## 2014-09-02 MED ORDER — NALBUPHINE HCL 10 MG/ML IJ SOLN: 5.0000 mg | Freq: Once | INTRAMUSCULAR | Status: AC | PRN

## 2014-09-02 MED ORDER — MAGNESIUM SULFATE 40 G IN LACTATED RINGERS - SIMPLE
2.0000 g/h | INTRAVENOUS | Status: DC
  Filled 2014-09-02: qty 500

## 2014-09-02 MED ORDER — ONDANSETRON HCL 4 MG/2ML IJ SOLN: 4.0000 mg | INTRAMUSCULAR | Status: DC | PRN

## 2014-09-02 MED ORDER — MORPHINE SULFATE 0.5 MG/ML IJ SOLN
INTRAMUSCULAR | Status: AC
  Filled 2014-09-02: qty 10

## 2014-09-02 MED ORDER — MAGNESIUM SULFATE BOLUS VIA INFUSION
4.0000 g | Freq: Once | INTRAVENOUS | Status: DC
  Filled 2014-09-02: qty 500

## 2014-09-02 MED ORDER — OXYCODONE-ACETAMINOPHEN 5-325 MG PO TABS
1.0000 | ORAL_TABLET | ORAL | Status: DC | PRN
  Administered 2014-09-03 (×3): 1 via ORAL
  Filled 2014-09-02 (×3): qty 1

## 2014-09-02 MED ORDER — BUPIVACAINE LIPOSOME 1.3 % IJ SUSP
20.0000 mL | Freq: Once | INTRAMUSCULAR | Status: AC
  Administered 2014-09-02: 20 mL
  Filled 2014-09-02: qty 20

## 2014-09-02 MED ORDER — FENTANYL CITRATE 0.05 MG/ML IJ SOLN
INTRAMUSCULAR | Status: AC
  Filled 2014-09-02: qty 2

## 2014-09-02 MED ORDER — SIMETHICONE 80 MG PO CHEW
80.0000 mg | CHEWABLE_TABLET | Freq: Three times a day (TID) | ORAL | Status: DC
  Administered 2014-09-03 – 2014-09-04 (×3): 80 mg via ORAL
  Filled 2014-09-02 (×3): qty 1

## 2014-09-02 MED ORDER — LACTATED RINGERS IV SOLN
INTRAVENOUS | Status: DC
  Administered 2014-09-03: 09:00:00 via INTRAVENOUS

## 2014-09-02 MED ORDER — SCOPOLAMINE 1 MG/3DAYS TD PT72: 1.0000 | MEDICATED_PATCH | Freq: Once | TRANSDERMAL | Status: DC

## 2014-09-02 SURGICAL SUPPLY — 35 items
BENZOIN TINCTURE PRP APPL 2/3 (GAUZE/BANDAGES/DRESSINGS) ×2 IMPLANT
CATH ROBINSON RED A/P 16FR (CATHETERS) IMPLANT
CLAMP CORD UMBIL (MISCELLANEOUS) IMPLANT
CLIP FILSHIE TUBAL LIGA STRL (Clip) ×2 IMPLANT
CLOTH BEACON ORANGE TIMEOUT ST (SAFETY) ×2 IMPLANT
DRAPE SHEET LG 3/4 BI-LAMINATE (DRAPES) IMPLANT
DRSG OPSITE POSTOP 4X10 (GAUZE/BANDAGES/DRESSINGS) ×2 IMPLANT
DURAPREP 26ML APPLICATOR (WOUND CARE) ×2 IMPLANT
ELECT REM PT RETURN 9FT ADLT (ELECTROSURGICAL) ×2
ELECTRODE REM PT RTRN 9FT ADLT (ELECTROSURGICAL) ×1 IMPLANT
EXTRACTOR VACUUM M CUP 4 TUBE (SUCTIONS) IMPLANT
GLOVE BIOGEL PI IND STRL 7.5 (GLOVE) ×2 IMPLANT
GLOVE BIOGEL PI INDICATOR 7.5 (GLOVE) ×2
GLOVE ECLIPSE 7.5 STRL STRAW (GLOVE) ×2 IMPLANT
GOWN STRL REUS W/TWL LRG LVL3 (GOWN DISPOSABLE) ×6 IMPLANT
KIT ABG SYR 3ML LUER SLIP (SYRINGE) IMPLANT
NEEDLE HYPO 22GX1.5 SAFETY (NEEDLE) ×2 IMPLANT
NEEDLE HYPO 25X5/8 SAFETYGLIDE (NEEDLE) IMPLANT
NS IRRIG 1000ML POUR BTL (IV SOLUTION) ×2 IMPLANT
PACK C SECTION WH (CUSTOM PROCEDURE TRAY) ×2 IMPLANT
PAD OB MATERNITY 4.3X12.25 (PERSONAL CARE ITEMS) ×2 IMPLANT
RTRCTR C-SECT PINK 25CM LRG (MISCELLANEOUS) IMPLANT
STRIP CLOSURE SKIN 1/2X4 (GAUZE/BANDAGES/DRESSINGS) ×2 IMPLANT
SUT MNCRL 0 VIOLET CTX 36 (SUTURE) IMPLANT
SUT MONOCRYL 0 CTX 36 (SUTURE)
SUT PLAIN 2 0 (SUTURE) ×1
SUT PLAIN ABS 2-0 CT1 27XMFL (SUTURE) ×1 IMPLANT
SUT VIC AB 0 CTX 36 (SUTURE) ×3
SUT VIC AB 0 CTX36XBRD ANBCTRL (SUTURE) ×3 IMPLANT
SUT VIC AB 2-0 CT1 27 (SUTURE) ×2
SUT VIC AB 2-0 CT1 TAPERPNT 27 (SUTURE) ×2 IMPLANT
SUT VIC AB 4-0 KS 27 (SUTURE) ×4 IMPLANT
SYR 30ML LL (SYRINGE) ×2 IMPLANT
TOWEL OR 17X24 6PK STRL BLUE (TOWEL DISPOSABLE) ×2 IMPLANT
TRAY FOLEY CATH 14FR (SET/KITS/TRAYS/PACK) ×2 IMPLANT

## 2014-09-02 NOTE — Anesthesia Postprocedure Evaluation (Signed)
  Anesthesia Post-op Note  Patient: Virginia Sanders  Procedure(s) Performed: Procedure(s): CESAREAN SECTION (N/A)  Patient is awake, responsive, moving her legs, and has signs of resolution of her numbness. Pain and nausea are reasonably well controlled. Vital signs are stable and clinically acceptable. Oxygen saturation is clinically acceptable. There are no apparent anesthetic complications at this time. Patient is ready for discharge.

## 2014-09-02 NOTE — Anesthesia Preprocedure Evaluation (Signed)
Anesthesia Evaluation  Patient identified by MRN, date of birth, ID band Patient awake    Reviewed: Allergy & Precautions, H&P , NPO status , Patient's Chart, lab work & pertinent test results  Airway Mallampati: II  TM Distance: >3 FB Neck ROM: full    Dental no notable dental hx.    Pulmonary former smoker,    Pulmonary exam normal       Cardiovascular hypertension, Pt. on medications     Neuro/Psych negative neurological ROS     GI/Hepatic negative GI ROS, Neg liver ROS,   Endo/Other  Morbid obesity  Renal/GU negative Renal ROS     Musculoskeletal   Abdominal (+) + obese,   Peds  Hematology negative hematology ROS (+)   Anesthesia Other Findings   Reproductive/Obstetrics (+) Pregnancy                             Anesthesia Physical Anesthesia Plan  ASA: III and emergent  Anesthesia Plan: Spinal   Post-op Pain Management:    Induction:   Airway Management Planned:   Additional Equipment:   Intra-op Plan:   Post-operative Plan:   Informed Consent: I have reviewed the patients History and Physical, chart, labs and discussed the procedure including the risks, benefits and alternatives for the proposed anesthesia with the patient or authorized representative who has indicated his/her understanding and acceptance.     Plan Discussed with: CRNA and Surgeon  Anesthesia Plan Comments:         Anesthesia Quick Evaluation

## 2014-09-02 NOTE — Op Note (Signed)
Virginia Sanders PROCEDURE DATE: 09/02/2014  PREOPERATIVE DIAGNOSIS: Intrauterine pregnancy at  [redacted]w[redacted]d weeks gestation; previous cesarean section, preeclampsia with severe features, undesired fertility  POSTOPERATIVE DIAGNOSIS: The same  PROCEDURE: Repeat Low Transverse Cesarean Section with bilateral tubal ligation  SURGEON:  Dr. Candelaria Celeste  ASSISTANT: Dr Fredirick Lathe.  The use of an assist was essential in providing adequate visualization and necessary throughout the entire case.  INDICATIONS: Virginia Sanders is a 24 y.o. [id number] at [redacted]w[redacted]d scheduled for cesarean section secondary to previous cesarean section, preeclampsia with severe features.  The risks of cesarean section discussed with the patient included but were not limited to: bleeding which may require transfusion or reoperation; infection which may require antibiotics; injury to bowel, bladder, ureters or other surrounding organs; injury to the fetus; need for additional procedures including hysterectomy in the event of a life-threatening hemorrhage; placental abnormalities wth subsequent pregnancies, incisional problems, thromboembolic phenomenon and other postoperative/anesthesia complications. The patient concurred with the proposed plan, giving informed written consent for the procedure.    FINDINGS:  Viable female infant in vertex presentation.  Apgars 7 and 8, weight pending. Clear amniotic fluid.  Intact placenta, three vessel cord.  Normal uterus, fallopian tubes and ovaries bilaterally.  ANESTHESIA:    Spinal INTRAVENOUS FLUIDS:800 ml ESTIMATED BLOOD LOSS: 500 ml URINE OUTPUT:  100 ml SPECIMENS: Placenta sent to L&D COMPLICATIONS: None immediate  PROCEDURE IN DETAIL:  The patient received intravenous antibiotics and had sequential compression devices applied to her lower extremities while in the preoperative area.  She was then taken to the operating room where spinal anesthesia was administered (epidural anesthesia was dosed up  to surgical level) and was found to be adequate. She was then placed in a dorsal supine position with a leftward tilt, and prepped and draped in a sterile manner.  A foley catheter was placed into her bladder and attached to constant gravity, which drained clear fluid throughout.  After an adequate timeout was performed, a Pfannenstiel skin incision was made with scalpel and carried through to the underlying layer of fascia. The fascia was incised in the midline and this incision was extended bilaterally using the Mayo scissors. Kocher clamps were applied to the superior aspect of the fascial incision and the underlying rectus muscles were dissected off with cautery and Mayo scissors. A similar process was carried out on the inferior aspect of the facial incision with Mayo scissors. The rectus muscles were separated in the midline bluntly and the peritoneum was entered bluntly. An Alexis retractor was placed to aid in visualization of the uterus.  Attention was turned to the lower uterine segment where a transverse hysterotomy was made with a scalpel and extended bilaterally bluntly. The infant was successfully delivered, and cord was clamped and cut and infant was handed over to awaiting neonatology team. Uterine massage was then administered and the placenta delivered intact with three-vessel cord. The uterus was then cleared of clot and debris.  The hysterotomy was closed with 0 Vicryl in a running locked fashion, and an imbricating layer was also placed with a 0 Vicryl. Overall, excellent hemostasis was noted.  Attention was then turned to the fallopian tubes.  A Filshie clip was placed on both tubes, about 2 cm from the cornua, with care given to incorporate the underlying mesosalpinx on both sides, allowing for bilateral tubal sterilization.  The abdomen and the pelvis were cleared of all clot and debris and the Jon Gills was removed. Hemostasis was confirmed on all surfaces.  The peritoneum  was reapproximated  using 2-0 vicryl running stitches. The fascia was then closed using 0 Vicryl in a running fashion.  The subcutaneous layer was reapproximated with plain gut and the skin was closed with 4-0 vicryl. The patient tolerated the procedure well. Sponge, lap, instrument and needle counts were correct x 2. She was taken to the recovery room in stable condition.    Virginia HeritageJacob J Binta Statzer, DO 09/02/2014 8:08 PM

## 2014-09-02 NOTE — Transfer of Care (Signed)
Immediate Anesthesia Transfer of Care Note  Patient: Virginia Sanders  Procedure(s) Performed: Procedure(s): CESAREAN SECTION (N/A)  Patient Location: PACU  Anesthesia Type:Spinal  Level of Consciousness: awake, alert , oriented and patient cooperative  Airway & Oxygen Therapy: Patient Spontanous Breathing  Post-op Assessment: Report given to RN and Post -op Vital signs reviewed and stable  Post vital signs: Reviewed and stable  Last Vitals:  Filed Vitals:   09/02/14 1808  BP:   Pulse: 118  Temp:   Resp:     Complications: No apparent anesthesia complications

## 2014-09-02 NOTE — Progress Notes (Signed)
To OR via stretcher

## 2014-09-02 NOTE — MAU Note (Signed)
Opened pt's chart to get MR number for charge card. Labels went with her to OR.

## 2014-09-02 NOTE — Progress Notes (Signed)
OR notified that patient is ready except for T&S. Decision made to wait for T&S result. EFM tracing reactive, EFM D/C'd.

## 2014-09-02 NOTE — Anesthesia Procedure Notes (Signed)
Spinal Patient location during procedure: OR Start time: 09/02/2014 6:47 PM End time: 09/02/2014 6:49 PM Staffing Anesthesiologist: Leilani AbleHATCHETT, [place] Preanesthetic Checklist Completed: patient identified, surgical consent, pre-op evaluation, timeout performed, IV checked, risks and benefits discussed and monitors and equipment checked Spinal Block Patient position: sitting Prep: DuraPrep Patient monitoring: heart rate, cardiac monitor, continuous pulse ox and blood pressure Approach: midline Location: L3-4 Injection technique: single-shot Needle Needle type: Pencan  Needle gauge: 24 G Needle length: 9 cm Needle insertion depth: 8 cm Assessment Sensory level: T4

## 2014-09-02 NOTE — Progress Notes (Signed)
Pt seen yesterday and labs done for PIH.  Woke up this AM with headache and "just don't feel good"  Swelling in feet have not gone down even with elevation

## 2014-09-02 NOTE — MAU Note (Signed)
Sent from [place] office with elevated BP and HA. For repeat C/S.

## 2014-09-02 NOTE — H&P (Signed)
LABOR ADMISSION HISTORY AND PHYSICAL  Virginia Sanders is a 24 y.o. female [phone] with IUP at 1083w2d by 5963w1d redating sono presenting for repeat c/s with BTL 2/2 preEclampsia with severe features. She reports +FMs, No LOF, no VB,has had blurry vision, headaches and peripheral edema, but no RUQ pain.  She plans on bottle feeding. She request BTL for birth control.  Took tylenol for headache with no improvement.  Dating: By redating 6263w1d sono --->  Estimated Date of Delivery: 09/14/14     Prenatal History/Complications:  Past Medical History: Past Medical History  Diagnosis Date  . Depression   . Anxiety   . Bipolar 1 disorder   . Abnormal Pap smear of cervix     Colpo    Past Surgical History: Past Surgical History  Procedure Laterality Date  . Bladder repair w/ cesarean section    . Wisdom tooth extraction    . Cesarean section      Obstetrical History: OB History    Gravida Para Term Preterm AB TAB SAB Ectopic Multiple Living   4 1 1  2  2   1       Social History: History   Social History  . Marital Status: Single    Spouse Name: N/A  . Number of Children: N/A  . Years of Education: N/A   Occupational History  . homemaker    Social History Main Topics  . Smoking status: Former Smoker -- 0.30 packs/day for 4 years    Types: Cigarettes    Quit date: 01/21/2014  . Smokeless tobacco: Never Used  . Alcohol Use: No  . Drug Use: No     Comment: history of marijuana and cocaine  Clean since 2/15  . Sexual Activity:    Partners: Male   Other Topics Concern  . Not on file   Social History Narrative    Family History: Family History  Problem Relation Age of Onset  . Diabetes Maternal Grandmother   . Diabetes Maternal Grandfather   . Hypertension Father   . Hypertension Maternal Grandfather   . Heart attack Paternal Grandmother   . Heart murmur Brother     Allergies: Allergies  Allergen Reactions  . Buspirone Other (See Comments)    Jittery  . Sulfa  Antibiotics Hives    Prescriptions prior to admission  Medication Sig Dispense Refill Last Dose  . calcium carbonate (TUMS - DOSED IN MG ELEMENTAL CALCIUM) 500 MG chewable tablet Chew 2 tablets by mouth 2 (two) times daily as needed for indigestion or heartburn.   Taking  . cephALEXin (KEFLEX) 500 MG capsule Take 1 capsule (500 mg total) by mouth 4 (four) times daily. 40 capsule 2 Taking  . Doxylamine-Pyridoxine (DICLEGIS) 10-10 MG TBEC Take 2 tablets by mouth as directed. 100 tablet 1 Taking  . escitalopram (LEXAPRO) 10 MG tablet Take 10 mg by mouth daily.   2 Taking  . lamoTRIgine (LAMICTAL) 150 MG tablet Take 300 mg by mouth daily.   Taking  . omeprazole (PRILOSEC OTC) 20 MG tablet Take 20 mg by mouth daily as needed (For heartburn.).   Taking  . Prenatal Vit-Fe Fumarate-FA (MULTIVITAMIN-PRENATAL) 27-0.8 MG TABS tablet Take 1 tablet by mouth daily at 12 noon. 30 each 6 Taking  . promethazine (PHENERGAN) 25 MG tablet Take 1 tablet (25 mg total) by mouth every 6 (six) hours as needed for nausea or vomiting. 30 tablet 1 Taking  . risperiDONE (RISPERDAL) 1 MG tablet Take 1 mg by mouth daily.  Taking     Review of Systems   All systems reviewed and negative except as stated in HPI Ht  (1.6 m)  Wt 249 lb (112.946 kg)  BMI 44.12 kg/m2  LMP 11/28/2013  Last menstrual period 11/28/2013. General appearance: alert and cooperative Lungs: clear to auscultation bilaterally Heart: regular rate and rhythm Abdomen: soft, non-tender; bowel sounds normal Extremities: Homans sign is negative, no sign of DVT, mild swelling DTR's 2+     Prenatal labs: ABO, Rh: O/POS/-- (08/31 1111) Antibody: NEG (08/31 1111) Rubella:   RPR: NON REAC (12/28 1458)  HBsAg: NEGATIVE (08/31 1111)  HIV: NONREACTIVE (12/28 1458)  GBS:    1 hr Glucola 119 Genetic screening  Normal 1st sgree, neg AFP Anatomy US normal   Clinic [facility]  Dating By 7 week [place]  Genetic Screen 1 Screen: normal   AFP:      negative                                 Anatomic [place]  normal but incomplete >>  F/U normal  GTT Early:      92         Third trimester: 119  TDaP vaccine Dec 2015  Flu vaccine  03/24/14  GBS  positive  Contraception  She is considering a BTL.  Baby Food bottle  Circumcision female  Pediatrician Dr. Lovenia Kim peds  Support Person Fayrene Fearing     No results found for this or any previous visit (from the past 24 hour(s)).  Patient Active Problem List   Diagnosis Date Noted  . Obesity affecting pregnancy, antepartum 03/24/2014  . Previous cesarean delivery affecting pregnancy, antepartum 03/08/2014  . Gestational hypertension, antepartum 03/08/2014  . ASCUS with positive high risk HPV 02/24/2014  . Normal pregnancy 02/24/2014  . Clinical depression 02/24/2014  . Neurosis, 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  . Addiction, marijuana 08/28/2013  . 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 [phone] at [redacted]w[redacted]d here for repeat cesarean section secondary to preeclampsia with severe features  #Labor: repeat c/s with BTL #MOF: bottle #MOC:BTL #Circ:  N/a, female #bipolar type I: continue homes #hx of THC: UDS, SW consult #preE with severe features: mag, severe 2/2 headache, visual changes  The risks of cesarean section and bilateral tubal ligation discussed with the patient included but were not limited to: bleeding which may require transfusion or reoperation; infection which may require antibiotics; injury to bowel, bladder, ureters or other surrounding organs; injury to the fetus; need for additional procedures including hysterectomy in the event of a life-threatening hemorrhage; placental abnormalities wth subsequent pregnancies, incisional problems, risk of ectopic pregnancy, thromboembolic phenomenon and other postoperative/anesthesia complications. The patient concurred with the proposed plan, giving informed written consent for the procedure.   Patient has  been NPO since 1000 she will remain NPO for procedure. Anesthesia and OR aware. Preoperative prophylactic antibiotics and SCDs ordered on call to the OR.  To OR when ready.     Joren Rehm ROCIO 09/02/2014, 5:08 PM

## 2014-09-02 NOTE — MAU Note (Signed)
Urine in lab 

## 2014-09-02 NOTE — Progress Notes (Signed)
Patient with moderate headache and seeing spots since this morning.  DBP 92; has GHTN, possible preeclampsia with severe feature (headache).  Delivery indicated.  Patient informed.  She desires RCS and BTS.; has been NPO since 1000.  [facility]  Dr. Adrian BlackwaterStinson (OB Attending on call notified); also notified Admitting, MAU and Dr. Arby BarretteHatchett (Anesthesiologist on call).

## 2014-09-03 ENCOUNTER — Encounter (HOSPITAL_COMMUNITY): Payer: Self-pay | Admitting: Anesthesiology

## 2014-09-03 ENCOUNTER — Encounter (HOSPITAL_COMMUNITY): Payer: Self-pay | Admitting: *Deleted

## 2014-09-03 LAB — CBC
HCT: 34.1 % — ABNORMAL LOW (ref 36.0–46.0)
Hemoglobin: 11.3 g/dL — ABNORMAL LOW (ref 12.0–15.0)
MCH: 30.6 pg (ref 26.0–34.0)
MCHC: 33.1 g/dL (ref 30.0–36.0)
MCV: 92.4 fL (ref 78.0–100.0)
Platelets: 234 10*3/uL (ref 150–400)
RBC: 3.69 MIL/uL — ABNORMAL LOW (ref 3.87–5.11)
RDW: 14 % (ref 11.5–15.5)
WBC: 12.5 10*3/uL — ABNORMAL HIGH (ref 4.0–10.5)

## 2014-09-03 LAB — RPR: RPR Ser Ql: NONREACTIVE

## 2014-09-03 MED ORDER — POTASSIUM CHLORIDE CRYS ER 20 MEQ PO TBCR
40.0000 meq | EXTENDED_RELEASE_TABLET | Freq: Two times a day (BID) | ORAL | Status: DC
  Administered 2014-09-03 – 2014-09-04 (×3): 40 meq via ORAL
  Filled 2014-09-03 (×5): qty 2

## 2014-09-03 MED ORDER — SODIUM CHLORIDE 0.9 % IJ SOLN
3.0000 mL | Freq: Two times a day (BID) | INTRAMUSCULAR | Status: DC
  Administered 2014-09-03: 3 mL via INTRAVENOUS

## 2014-09-03 MED ORDER — SODIUM CHLORIDE 0.9 % IJ SOLN: 3.0000 mL | INTRAMUSCULAR | Status: DC | PRN

## 2014-09-03 MED ORDER — PNEUMOCOCCAL VAC POLYVALENT 25 MCG/0.5ML IJ INJ
0.5000 mL | INJECTION | INTRAMUSCULAR | Status: AC
  Administered 2014-09-04: 0.5 mL via INTRAMUSCULAR
  Filled 2014-09-03 (×2): qty 0.5

## 2014-09-03 MED ORDER — SODIUM CHLORIDE 0.9 % IV SOLN: 250.0000 mL | INTRAVENOUS | Status: DC | PRN

## 2014-09-03 NOTE — Progress Notes (Signed)
CSW received consult for mental health and substance use concerns.  CSW will meet with [name] once transferred to MBU. 

## 2014-09-03 NOTE — Progress Notes (Signed)
UR chart review completed.  

## 2014-09-03 NOTE — Anesthesia Postprocedure Evaluation (Signed)
Anesthesia Post Note  Patient: Virginia Sanders  Procedure(s) Performed: Procedure(s) (LRB): REPEAT CESAREAN SECTION WITH BILATERAL TUBAL LIGATION (Bilateral)  Anesthesia type: Spinal  Patient location: Mother/Baby  Post pain: Pain level controlled  Post assessment: Post-op Vital signs reviewed  Last Vitals:  Filed Vitals:   09/03/14 0900  BP: 105/65  Pulse: 83  Temp:   Resp: 18    Post vital signs: Reviewed  Level of consciousness: awake  Complications: No apparent anesthesia complications

## 2014-09-03 NOTE — Progress Notes (Signed)
Subjective: Postpartum Day 1: Cesarean Delivery with BTL. Patient reports tolerating PO.  Ambulating well.  Has mild incisional pain.    Objective: Vital signs in last 24 hours: Temp:  [97.9 F (36.6 C)-98.6 F (37 C)] 97.9 F (36.6 C) (03/09 0400) Pulse Rate:  [70-128] 78 (03/09 0600) Resp:  [15-28] 17 (03/09 0600) BP: (109-143)/(45-97) 114/64 mmHg (03/09 0600) SpO2:  [95 %-100 %] 97 % (03/09 0600) Weight:  [235 lb 4.8 oz (106.731 kg)-249 lb (112.946 kg)] 238 lb 9.6 oz (108.228 kg) (03/09 0500)  Physical Exam:  General: alert, cooperative and mild distress Heart: regular rate, no murmur Lungs: clear to auscultation bilaterally, no wheezing. Lochia: appropriate Uterine Fundus: firm Incision: no significant drainage, no dehiscence DVT Evaluation: No evidence of DVT seen on physical exam. Negative Homan's sign. No cords or calf tenderness.   Recent Labs  09/02/14 1728 09/03/14 0532  HGB 12.1 11.3*  HCT 36.6 34.1*    Assessment/Plan: Status post Cesarean section. Doing well postoperatively.  Continue current care. Magnesium for 24hours, then transfer to floor.   Replace potassium - 40 meq   STINSON, JACOB JEHIEL 09/03/2014, 6:43 AM

## 2014-09-04 ENCOUNTER — Encounter: Payer: Medicaid Other | Admitting: Obstetrics & Gynecology

## 2014-09-04 LAB — BIRTH TISSUE RECOVERY COLLECTION (PLACENTA DONATION)

## 2014-09-04 MED ORDER — OXYCODONE-ACETAMINOPHEN 5-325 MG PO TABS: 1.0000 | ORAL_TABLET | ORAL | Status: DC | PRN

## 2014-09-04 NOTE — Progress Notes (Signed)
Clinical Social Work Department PSYCHOSOCIAL ASSESSMENT - MATERNAL/CHILD 09/04/2014  Patient:  Virginia Sanders,Virginia Sanders  Account Number:  [id number]  Admit Date:  09/02/2014  Childs Name:   Virginia Sanders   Clinical Social Worker:  Elbony Mcclimans, CLINICAL SOCIAL WORKER   Date/Time:  09/04/2014 10:00 AM  Date Referred:  09/02/2014   Referral source  Central Nursery     Referred reason  Behavioral Health Issues  Substance Abuse   Other referral source:    I:  FAMILY / HOME ENVIRONMENT Child's legal guardian:  PARENT  Guardian - Name Guardian - Age Guardian - Address  Virginia Sanders 23 [address] [place], [place] [address]  James  same as above   Other household support members/support persons Name Relationship DOB  Virginia Sanders SON 02/24/10   Other support:   [name] reported that her mother lives about 30 minutes away from her and is supportive. She also identified her sister as a support person.    II  PSYCHOSOCIAL DATA Information Source:  Patient Interview  Financial and Community Resources Employment:   FOB is currently employed, and will be taking 2 weeks off from work to provide additional support and assistance.   Financial resources:  Medicaid If Medicaid - County:  [place] Other  WIC  Food Stamps   School / Grade:  N/A Maternity Care Coordinator / Child Services Coordination / Early Interventions:   None reported  Cultural issues impacting care:   None reported    III  STRENGTHS Strengths  Adequate Resources  Home prepared for Child (including basic supplies)  Supportive family/friends   Strength comment:  [name] presents as receptive to discussing her mental health history. She presents as well prepared for the transition to postpartum.  IV  RISK FACTORS AND CURRENT PROBLEMS Current Problem:  YES   Risk Factor & Current Problem Patient Issue Family Issue Risk Factor / Current Problem Comment  Mental Illness Y N [name] presents with diagnosis of bipolar, depression,  and anxiety. She stated that she was diagnosed less than 2 years ago, and is currently in therapy and medication management. She is currently prescribed Lamictal, Lexapro, and Risperdal.  Substance Abuse Y N [name] presents with history of cocaine use (last use Feb 2015).  She also presents with history of THC use.  [name] with a +UDS for THC 02/24/14 but -UDS in Feb and March.  Infant UDS is negative and MDS is pending.         V  SOCIAL WORK ASSESSMENT CSW received consult due to history of bipolar, depression, anxiety, and substance use (cocaine and THC).  [name] was alone upon CSW arrival, and was easily engaged and receptive to the visit  She was moving around in her room and attending to the infant's needs.  She presented in a pleasant mood, displayed a full range in affect, and there no acute mental health symptoms noted. Toward the end of the visit, the FOB arrived.  He was receptive to the visit as well.  [name]'s son arrived as CSW was leaving the room, and he smiled and appeared excited to see his mother and his little sister.   CSW assisted the [name] to process normative thoughts and feelings as she transitions to the adjustment to having one child to two.  She reflected upon already feeling the "tension and pull" since her son has had a difficult time being away from her, but yet knowing that she needs to be in the hospital for both herself and the infant.    She shared that during the pregnancy she has been concerned about her ability to provide sufficient attention to both of her children, but discussed that she is feeling "better" now that the infant has arrived, since she has realized that it currently is not as bad as she anticipated. She recognized that it will be difficult once she returns home, but she shared belief that she is well supported and that both she and the FOB have discussed ways to ensure that their son is not ignored or feeling left out.    The [name] openly discussed diagnoses of bipolar,  depression, and anxiety. She stated that it was a sense of "relief" when she was diagnosed less than 2 years ago since it assisted her to gain insight on how she was feeling and to receive the appropriate treatment. She presented with insight as she was able to reflect upon the symptoms that she experienced that led to her diagnoses (limited need to sleep, intense and frequent mood swings) and discussed how it was a difficult time in her life since it also led to cocaine use.  She shared that once she was received the diagnoses, she was prescribed medications and began therapy.  The [name] discussed the numerous changes that have occurred since she has been in treatment, and shared that it continues to be surreal at times since her life has "changed for the better".   She stated that she learned of the pregnancy only 3 months after becoming stable on her medications.  She openly shared how it was difficult for her when she learned of the pregnancy since she had feared how the pregnancy make impact her mood and sense of stability.  The [name] reported that they changed her medications, but that she was committed and motivated to continue her medications during the pregnancy. She shared that it was difficult at times since she received negative feedback from people since they told her that she was being "selfish" for continuing on her medications, but she expressed confidence in her decision to continue on the medications since she knows that she needs to remain stable in order to parent her son and be a good mother for this infant.  She reported despite negative feedback about her choice to formula feed, she reported strong motivation to continue on her medications since it is for the best.   CSW continued to explore and provide support to the [name] about how mental health impacts parenting.  The [name] stated that it is "difficult" at times since she can frequently have limited patience and she feels "guilty" at times when  she needs to take a "break" in order to self-regulate.  CSW validated her feelings, but also processed with [name] how she is modeling emotional regulation to her son since she is demonstrating importance of calming down before interacting with others.  She smiled as she discussed that she is "doing the best I can".  The [name] discussed numerous strategies that she has learned during the past few years to help her be a positive parent and role model for her son.   [name] shared that she is concerned about her risk of postpartum depression. The [name] discussed that she has been researching the symptoms, and that there have been numerous conversations with her mental health providers about her increased risk.  She acknowledged that she is at risk for being hyper-sensitive to her feelings after birth, and recognized that there is a difference between the baby blues and postpartum depression.    She expressed confidence in her ability to notify her support system and her providers in the event that she experiences symptoms, and stated that she has a follow-up appointment with her providers in the next few weeks to assess how she is doing and to determine if there will be any need to adjust medications.    [name] acknowledged substance use history. She reported cocaine use to assist her to cope with a job loss, limited access to transportation (feeling isolated) and the sudden death of her brother 2 years ago. [name] shared that she has not had any cocaine in more than a year (February 2015).  She reported infrequent urges, but stated that she has never thought about acting on the urge since she knows that she could "lose everything" if she were to relapse.  She demonstrated ability to discuss how if she were to relapse in would feel "good" in the moment, but would have numerous consequences that she is not wanting in her life.  [name] discussed how therapy has assisted her to engage in self-talk and utilize her rationale mind.   [name]  also acknowledged history of THC use.  She initially reported no use since February of 2015, until CSW inquired about UDS from August. She did state, "yeah, that's about right".  She was vague about THC use, but denied any use since then.  [name] and FOB verbalized understanding of the hospital drug screen policy, and expressed confidence that the MDS will be negative. Family aware that CSW will make a CPS report if the MDS is positive for substances.   [name] and FOB denied questions, concerns, or needs at this time.  [name] expressed appreciation for the visit, and agreed to contact CSW if needs arise.   VI SOCIAL WORK PLAN Social Work Plan  Patient/Family Education  No Further Intervention Required / No Barriers to Discharge   Type of pt/family education:   Posptartum depression and the Baby Blues  Hospital drug screen policy   If child protective services report - county:  N/A If child protective services report - date:  N/A Information/referral to community resources comment:   No referrals needed. [name] is currently participating in therapy and medication management.  She has follow up appointments with providers alrady established.  No other needs identified.   Other social work plan:   [name] presents with insight, self-awareness, and motivation to continue to engage in mental health treatment. She has awareness of need to closely monitor her mood and reports that her mood has been stable during the pregnancy. Despite these strengths, she will benefit from close monitoring of symptoms postpartum.  [name] reports feeling confident in her decisions to continue psychotropic medications, but may require support to assist her to maintain confidence in her decision since she has received negative feedback about her decisions.   CSW to monitor MDS and will make a CPS report if positive for substances.   CSW to follow up as needed or upon [name] request.     

## 2014-09-04 NOTE — Discharge Summary (Signed)
Obstetric Discharge Summary Reason for Admission: cesarean section- planned repeat Prenatal Procedures: Preeclampsia Intrapartum Procedures: cesarean: low cervical, transverse and tubal ligation Postpartum Procedures: none Complications-Operative and Postpartum: none   23yo [id number] at 2640w2d admitted from [facility] for preecalmpsia with severe features; delivered by repeat cesarean section with BTL. She rec'd 24 hrs of PP magnesium therapy and was diuresing well with nl BPs at time of discharge.  HEMOGLOBIN  Date Value Ref Range Status  09/03/2014 11.3* 12.0 - 15.0 g/dL Final   HCT  Date Value Ref Range Status  09/03/2014 34.1* 36.0 - 46.0 % Final    Physical Exam:  General: alert, cooperative, appears stated age and no distress Lochia: appropriate Uterine Fundus: firm Incision: healing well, no significant drainage, no significant erythema DVT Evaluation: No evidence of DVT seen on physical exam.  Discharge Diagnoses: Term Pregnancy-delivered and Preelampsia  Discharge Information: Date: 09/04/2014 Activity: pelvic rest Diet: routine Medications: Ibuprofen and Percocet Condition: stable and improved Instructions: refer to practice specific booklet Discharge to: home Follow-up Information    [facility] [place]. Schedule an appointment as soon as possible for a visit in 4 weeks.   Specialty:  Obstetrics and Gynecology   Contact information:   [address], [address] [place] [id number] [phone]      Newborn Data: Live born female  Birth Weight: 7 lb 13.6 oz (3560 g) APGAR: 7, 8  Feeds: bottle Contraception: s/p BTL  Henson,Amber 09/04/2014, 7:57 AM   I have seen and examined this patient and I agree with the above. Cam HaiSHAW, Tawan Degroote CNM 9:38 AM 09/04/2014

## 2014-09-05 ENCOUNTER — Inpatient Hospital Stay (HOSPITAL_COMMUNITY): Admission: RE | Admit: 2014-09-05 | Payer: Medicaid Other | Source: Ambulatory Visit

## 2014-09-08 ENCOUNTER — Inpatient Hospital Stay (HOSPITAL_COMMUNITY)
Admission: RE | Admit: 2014-09-08 | Payer: Medicaid Other | Source: Ambulatory Visit | Admitting: Obstetrics and Gynecology

## 2014-09-08 ENCOUNTER — Encounter (HOSPITAL_COMMUNITY): Payer: Self-pay | Admitting: Family Medicine

## 2014-09-08 ENCOUNTER — Encounter (HOSPITAL_COMMUNITY): Admission: RE | Payer: Self-pay | Source: Ambulatory Visit

## 2014-09-08 SURGERY — Surgical Case
Anesthesia: Regional | Site: Abdomen | Laterality: Bilateral

## 2014-09-22 ENCOUNTER — Encounter: Payer: Self-pay | Admitting: *Deleted

## 2014-09-30 ENCOUNTER — Ambulatory Visit (INDEPENDENT_AMBULATORY_CARE_PROVIDER_SITE_OTHER): Payer: Medicaid Other | Admitting: Obstetrics & Gynecology

## 2014-09-30 ENCOUNTER — Encounter: Payer: Self-pay | Admitting: Obstetrics & Gynecology

## 2014-09-30 VITALS — BP 110/72 | HR 81 | Resp 16 | Ht 65.0 in | Wt 227.0 lb

## 2014-09-30 DIAGNOSIS — F53 Puerperal psychosis: Secondary | ICD-10-CM

## 2014-09-30 DIAGNOSIS — IMO0002 Reserved for concepts with insufficient information to code with codable children: Secondary | ICD-10-CM

## 2014-09-30 NOTE — Progress Notes (Signed)
Patient ID: Virginia Sanders, female   DOB: 1991/01/07, 24 y.o.   MRN: [id number] Post Partum Exam  Virginia Sanders is a 24 y.o. female who presents for a postpartum visit. She is 4 weeks week postpartum following a low cervical transverse Cesarean section. I have fully reviewed the prenatal and intrapartum course. The delivery was at 38 gestational weeks. Outcome: .female child weighing 7lb 13oz epidural. Postpartum course has been un eventful. Baby's course has been uneventful except for having thrush.Pecola Leisure. Baby is feeding by bottle - Similac Sensitive RS. Bleeding staining only. Bowel function is normal. Bladder function is normal. Patient is sexually active. Contraception method is BTL Postpartum depression screening: positive.  Pt is under care of psychiatrist for depression, anxiety, and bipolar.  She sees her MD on Monday.  She denies HI and SI.  Will let her psych MD adjust meds.  The following portions of the patient's history were reviewed and updated as appropriate: allergies, current medications, past family history, past medical history, past social history, past surgical history and problem list.  Review of Systems Pertinent items are noted in HPI.   Objective:    BP 116/78 mmHg  Pulse 78  Resp 16  Ht 5\' 5"  (1.651 m)  Wt 211 lb (95.709 kg)  BMI 35.11 kg/m2  Breastfeeding? Yes  General:  alert, cooperative and no distress   Breasts:  negative  Lungs: clear to auscultation bilaterally  Heart:  regular rate and rhythm  Abdomen: soft, non-tender; bowel sounds normal; no masses,  no organomegaly   Vulva:  not evaluated  Vagina: not evaluated  Cervix:  not evaluated  Corpus: not examined  Adnexa:  not evaluated  Rectal Exam: Not performed.        Assessment:    Nml postpartum exam. Mild exacerbation of anxiety symptoms Pap due 2017  Plan:    1. Contraception: tubal ligation 2. F/U with Psych as above 3.  Pap 2017 3. Follow up in: 1 year or as needed.

## 2015-10-26 DIAGNOSIS — E7800 Pure hypercholesterolemia, unspecified: Secondary | ICD-10-CM | POA: Insufficient documentation

## 2016-01-11 DIAGNOSIS — Z720 Tobacco use: Secondary | ICD-10-CM | POA: Insufficient documentation

## 2016-08-12 IMAGING — US US MFM OB COMPLETE +14 WKS
1 series · 13 of 13 positions shown · non-contrast
Comparison: none

[Series 1: us mfm ob complete +14 wks · 0.23mm/px · 13 of 13 slices shown]
[im 1/13]
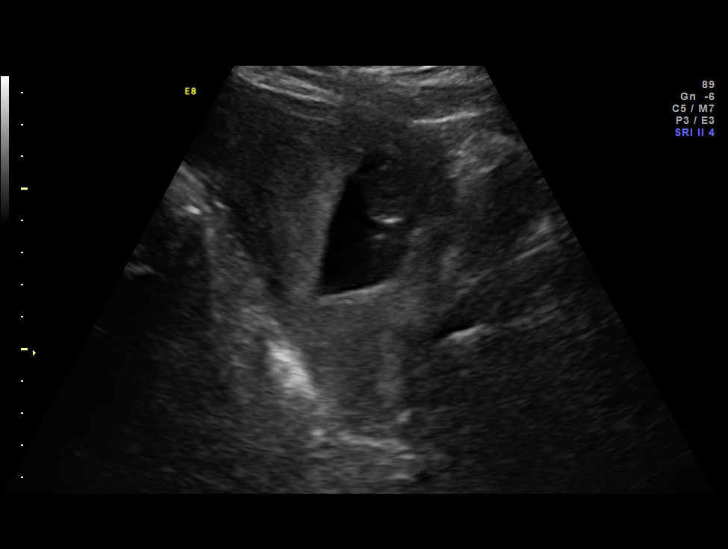
[im 2/13]
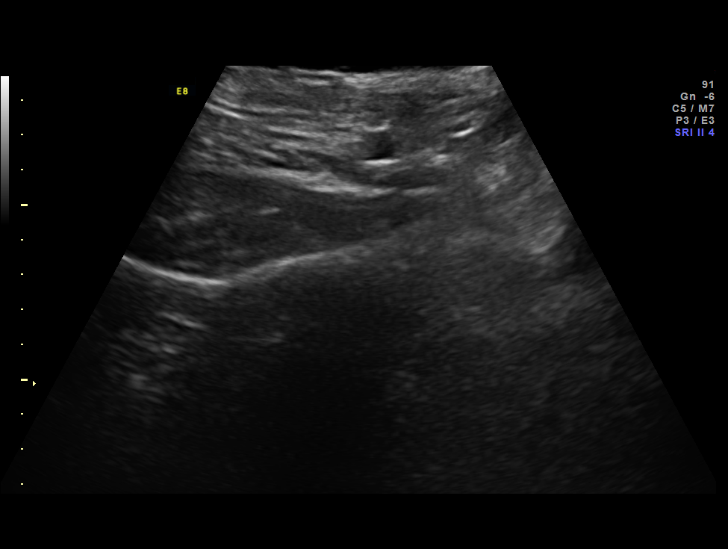
[im 3/13]
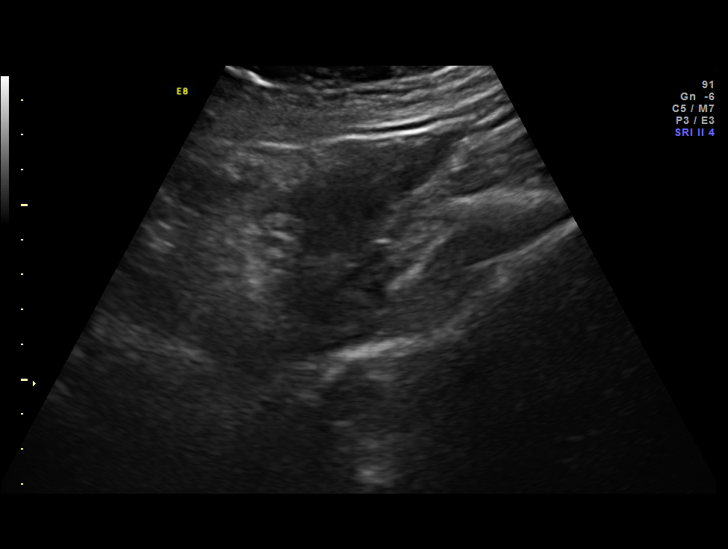
[im 4/13]
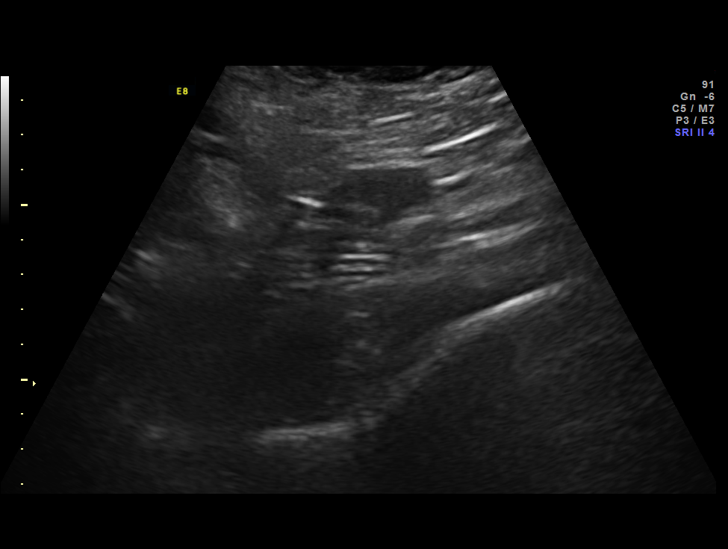
[im 5/13]
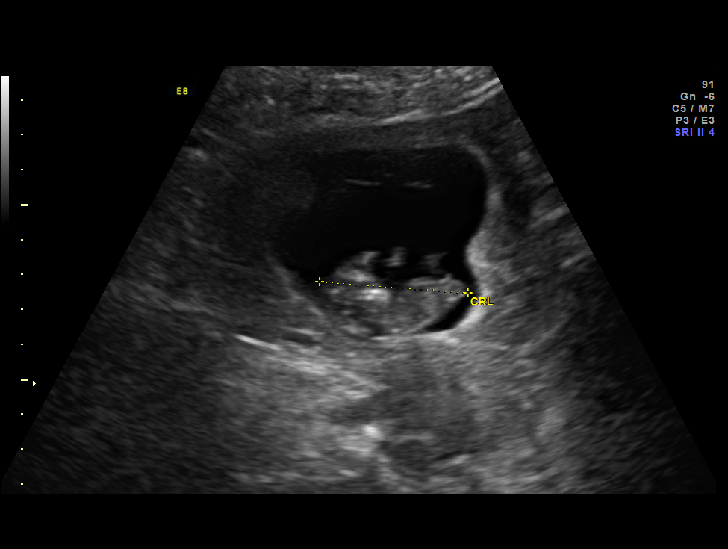
[im 6/13]
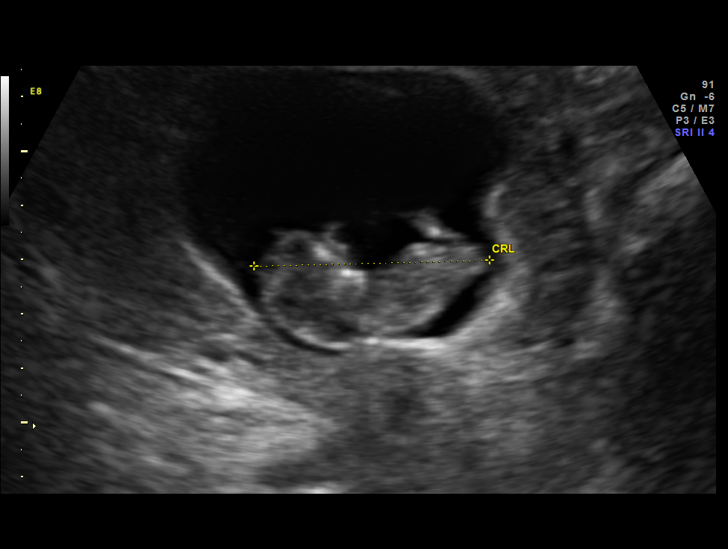
[im 7/13]
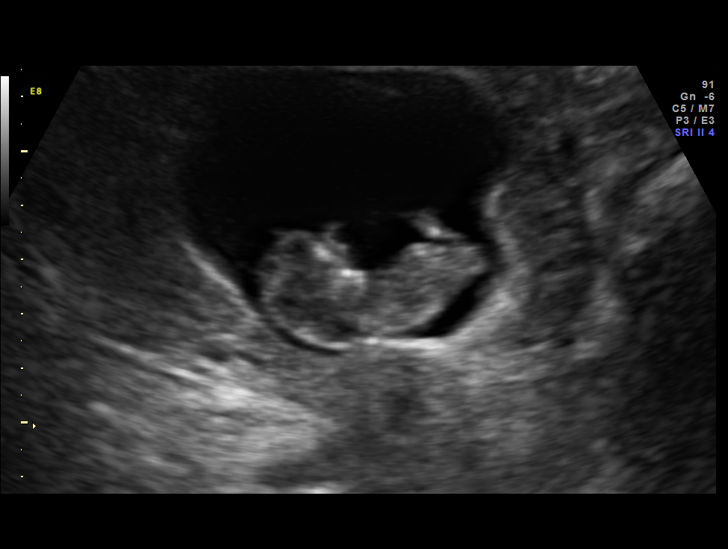
[im 8/13]
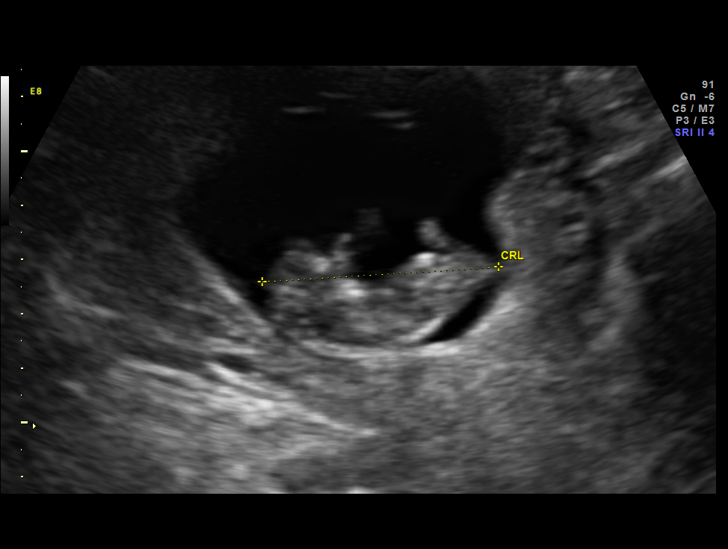
[im 9/13]
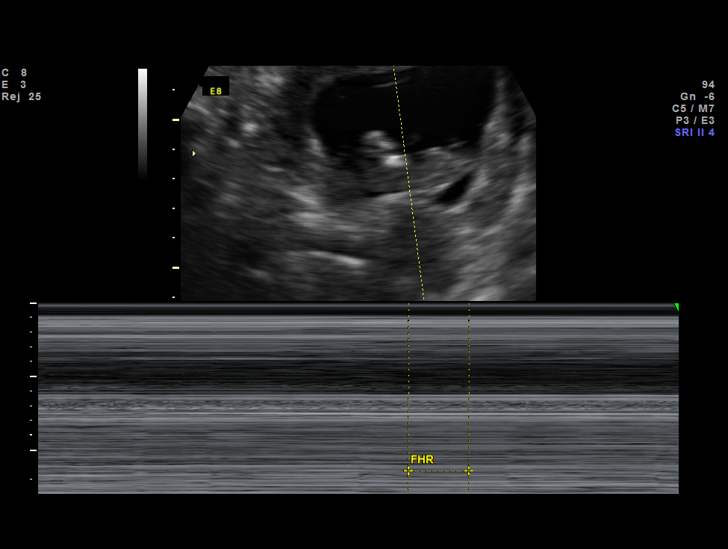
[im 10/13]
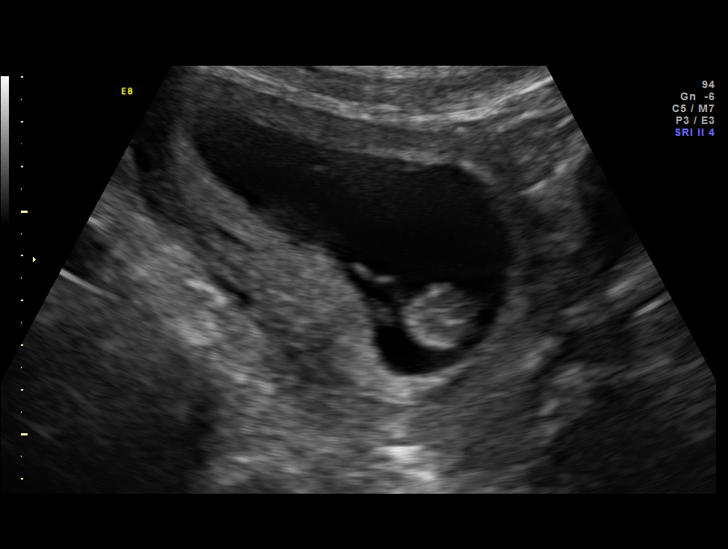
[im 11/13]
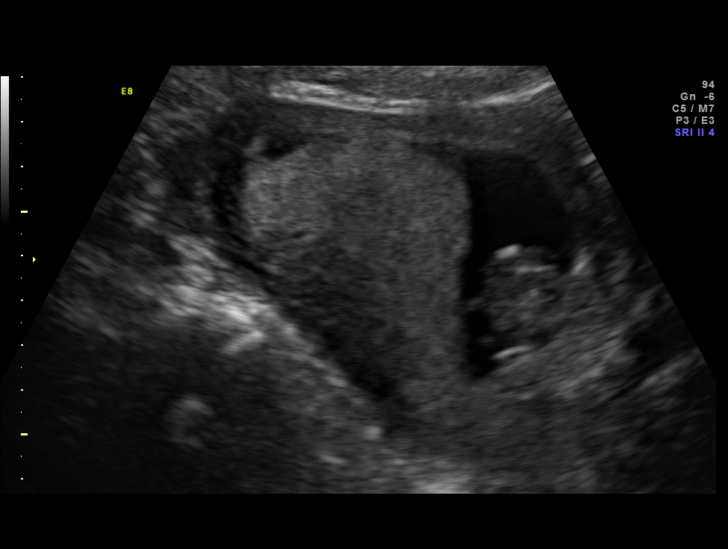
[im 12/13]
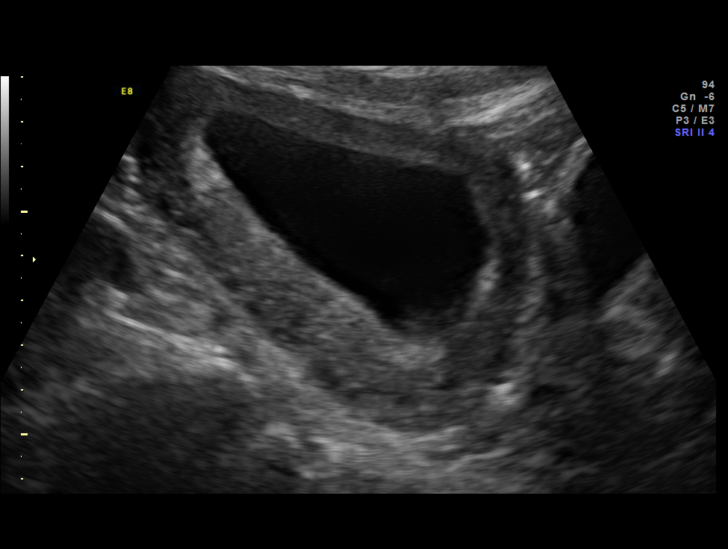
[im 13/13]
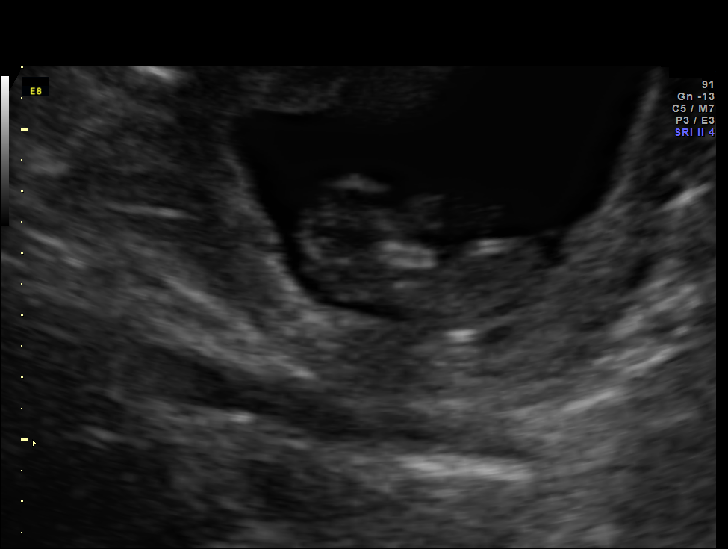

[13 of 13 positions shown; findings below may reference images not displayed]

OBSTETRICS REPORT
                      (Signed Final 02/25/2014 [DATE])

Service(s) Provided

 US MFM OB COMP LESS THAN 14 WEEKS                     76801.4
Indications

 First trimester aneuploidy screen (NT)
Fetal Evaluation

 Num Of Fetuses:    1
 Preg. Location:    Intrauterine
 Yolk Sac:          Visualized
 Fetal Pole:        Visualized
 Fetal Heart Rate:  167                          bpm
 Cardiac Activity:  Observed

 Amniotic Fluid
 AFI FV:      Subjectively within normal limits
Biometry

 CRL:     43.2  mm     G. Age:  11w 0d                 EDD:    09/16/14
Gestational Age

 LMP:           12w 5d        Date:  11/28/13                 EDD:   09/04/14
 Best:          11w 2d     Det. By:  Early Ultrasound         EDD:   09/14/14
                                     (01/20/14)
Cervix Uterus Adnexa

 Cervix:       Normal appearance by transabdominal scan. Appears
               closed, without funnelling.
Impression

 SIUP at 11+2 weeks
 No gross abnormalities identified
 NT measurement could not be obtained due to early GA
 Normal amniotic fluid volume
 Measurements consistent with early US
Recommendations

 Follow-up for first trimester screen in 1-2 weeks
 questions or concerns.

## 2016-12-01 ENCOUNTER — Other Ambulatory Visit (HOSPITAL_COMMUNITY)
Admission: RE | Admit: 2016-12-01 | Discharge: 2016-12-01 | Disposition: A | Discharge status: Home / Self Care | Payer: Medicaid Other | Source: Ambulatory Visit | Attending: Obstetrics & Gynecology | Admitting: Obstetrics & Gynecology

## 2016-12-01 ENCOUNTER — Ambulatory Visit (INDEPENDENT_AMBULATORY_CARE_PROVIDER_SITE_OTHER): Payer: Medicaid Other | Admitting: Obstetrics & Gynecology

## 2016-12-01 ENCOUNTER — Encounter: Payer: Self-pay | Admitting: Obstetrics & Gynecology

## 2016-12-01 VITALS — BP 123/83 | HR 79 | Ht 63.0 in | Wt 285.0 lb

## 2016-12-01 DIAGNOSIS — Z01419 Encounter for gynecological examination (general) (routine) without abnormal findings: Secondary | ICD-10-CM | POA: Insufficient documentation

## 2016-12-01 DIAGNOSIS — Z0000 Encounter for general adult medical examination without abnormal findings: Secondary | ICD-10-CM | POA: Diagnosis not present

## 2016-12-01 NOTE — Addendum Note (Signed)
Addended by: Kathie DikeSOLA, Deniesha Stenglein J on: 12/01/2016 02:07 PM   Modules accepted: Orders

## 2016-12-01 NOTE — Progress Notes (Signed)
Subjective:    Virginia Sanders is a 26 y.o. SW P2 (26 yo and 86 yo kids) female who presents for an annual exam. The patient has no complaints today. She has monthly periods although not exactly q 4 weeks. Some pain with periods. She takes IBU 800 or pamprim with some help The patient is sexually active. GYN screening history: last pap: was normal. The patient wears seatbelts: yes. The patient participates in regular exercise: yes. Has the patient ever been transfused or tattooed?: no. The patient reports that there is not domestic violence in her life.   Menstrual History: OB History    Gravida Para Term Preterm AB Living   4 2 2   2 1    SAB TAB Ectopic Multiple Live Births   2     0 1      Menarche age: 89 Patient's last menstrual period was 11/16/2016.    The following portions of the patient's history were reviewed and updated as appropriate: allergies, current medications, past family history, past medical history, past social history, past surgical history and problem list.  Review of Systems Pertinent items are noted in HPI.   Monogamous for 11 years, lives together Homemaker FH- no breast/gyn/colon cancer She has a Primary care MD   Objective:    BP 123/83   Pulse 79   Ht 5\' 3"  (1.6 m)   Wt 285 lb (129.3 kg)   LMP 11/16/2016   BMI 50.49 kg/m   General Appearance:    Alert, cooperative, no distress, appears stated age  Head:    Normocephalic, without obvious abnormality, atraumatic  Eyes:    PERRL, conjunctiva/corneas clear, EOM's intact, fundi    benign, both eyes  Ears:    Normal TM's and external ear canals, both ears  Nose:   Nares normal, septum midline, mucosa normal, no drainage    or sinus tenderness  Throat:   Lips, mucosa, and tongue normal; teeth and gums normal  Neck:   Supple, symmetrical, trachea midline, no adenopathy;    thyroid:  no enlargement/tenderness/nodules; no carotid   bruit or JVD  Back:     Symmetric, no curvature, ROM normal, no CVA tenderness   Lungs:     Clear to auscultation bilaterally, respirations unlabored  Chest Wall:    No tenderness or deformity   Heart:    Regular rate and rhythm, S1 and S2 normal, no murmur, rub   or gallop  Breast Exam:    No tenderness, masses, or nipple abnormality  Abdomen:     Soft, non-tender, bowel sounds active all four quadrants,    no masses, no organomegaly  Genitalia:    Normal female without lesion, discharge or tenderness, normal cervix, vagina, no masses palpable     Extremities:   Extremities normal, atraumatic, no cyanosis or edema  Pulses:   2+ and symmetric all extremities  Skin:   Skin color, texture, turgor normal, no rashes or lesions  Lymph nodes:   Cervical, supraclavicular, and axillary nodes normal  Neurologic:   CNII-XII intact, normal strength, sensation and reflexes    throughout  .    Assessment:    Healthy female exam.    Plan:     Thin prep Pap smear.   Dysmneorrhea- offered OCPs but at this time she declines Rec IBU

## 2016-12-05 LAB — CYTOLOGY - PAP
Adequacy: ABSENT
Diagnosis: NEGATIVE

## 2016-12-08 DIAGNOSIS — IMO0002 Reserved for concepts with insufficient information to code with codable children: Secondary | ICD-10-CM | POA: Insufficient documentation

## 2017-04-17 ENCOUNTER — Encounter (HOSPITAL_COMMUNITY): Payer: Self-pay | Admitting: *Deleted

## 2017-04-17 ENCOUNTER — Inpatient Hospital Stay (HOSPITAL_COMMUNITY)
Admission: AD | Admit: 2017-04-17 | Discharge: 2017-04-17 | Disposition: A | Discharge status: Home / Self Care | Payer: Medicaid Other | Source: Ambulatory Visit | Attending: Obstetrics and Gynecology | Admitting: Obstetrics and Gynecology

## 2017-04-17 DIAGNOSIS — F17210 Nicotine dependence, cigarettes, uncomplicated: Secondary | ICD-10-CM | POA: Diagnosis not present

## 2017-04-17 DIAGNOSIS — N939 Abnormal uterine and vaginal bleeding, unspecified: Secondary | ICD-10-CM | POA: Insufficient documentation

## 2017-04-17 LAB — POCT PREGNANCY, URINE: Preg Test, Ur: NEGATIVE

## 2017-04-17 LAB — CBC
HCT: 40.5 % (ref 36.0–46.0)
Hemoglobin: 13.5 g/dL (ref 12.0–15.0)
MCH: 30.3 pg (ref 26.0–34.0)
MCHC: 33.3 g/dL (ref 30.0–36.0)
MCV: 91 fL (ref 78.0–100.0)
Platelets: 282 10*3/uL (ref 150–400)
RBC: 4.45 MIL/uL (ref 3.87–5.11)
RDW: 13.3 % (ref 11.5–15.5)
WBC: 8.1 10*3/uL (ref 4.0–10.5)

## 2017-04-17 LAB — URINALYSIS, ROUTINE W REFLEX MICROSCOPIC
Bilirubin Urine: NEGATIVE
Glucose, UA: NEGATIVE mg/dL
Hgb urine dipstick: NEGATIVE
Ketones, ur: NEGATIVE mg/dL
Leukocytes, UA: NEGATIVE
Nitrite: NEGATIVE
Protein, ur: NEGATIVE mg/dL
Specific Gravity, Urine: 1.021 (ref 1.005–1.030)
pH: 7 (ref 5.0–8.0)

## 2017-04-17 NOTE — MAU Provider Note (Signed)
History     CSN: [id number]  Arrival date and time: 04/17/17 1148   First Provider Initiated Contact with Patient 04/17/17 1249      Chief Complaint  Patient presents with  . Vaginal Bleeding   26 yo female who presents for constant vaginal bleeding x 5 weeks. Patient says there was a period of 4 days without bleeding. Patient says bleeding is very light today. She has been seen in ED twice for the same complaint. She underwent US of abdomen and CT abdomen, both of which were normal. Patient came in today because she could not get an appointment with her PCP.      Past Medical History:  Diagnosis Date  . Abnormal Pap smear of cervix    Colpo  . Anxiety   . Bipolar 1 disorder (HCC)   . Bipolar 1 disorder (HCC)   . Depression    Bipolar    Past Surgical History:  Procedure Laterality Date  . BLADDER REPAIR W/ CESAREAN SECTION    . BUNIONECTOMY Bilateral   . CESAREAN SECTION    . CESAREAN SECTION N/A 09/02/2014   Procedure: CESAREAN SECTION;  Surgeon: Levie Heritage, DO;  Location: [facility];  Service: Obstetrics;  Laterality: N/A;  . WISDOM TOOTH EXTRACTION      Family History  Problem Relation Age of Onset  . Hypertension Father   . Diabetes Maternal Grandmother   . Diabetes Maternal Grandfather   . Hypertension Maternal Grandfather   . Heart attack Paternal Grandmother   . Heart murmur Brother     Social History  Substance Use Topics  . Smoking status: Current Every Day Smoker    Packs/day: 0.25    Years: 4.00    Types: Cigarettes    Last attempt to quit: 01/21/2014  . Smokeless tobacco: Never Used  . Alcohol use No    Allergies:  Allergies  Allergen Reactions  . Hydrocodone-Acetaminophen Itching  . Buspirone Other (See Comments)    Jittery  . Sulfa Antibiotics Hives    Prescriptions Prior to Admission  Medication Sig Dispense Refill Last Dose  . escitalopram (LEXAPRO) 10 MG tablet Take 10 mg by mouth daily.   2 Not Taking  . lamoTRIgine  (LAMICTAL) 150 MG tablet Take 300 mg by mouth daily.   Not Taking  . Prenatal Vit-Fe Fumarate-FA (MULTIVITAMIN-PRENATAL) 27-0.8 MG TABS tablet Take 1 tablet by mouth daily at 12 noon. (Patient not taking: Reported on 12/01/2016) 30 each 6 Not Taking  . risperiDONE (RISPERDAL) 1 MG tablet Take 1 mg by mouth daily.    Not Taking    Review of Systems  Constitutional: Negative for chills and fever.  HENT: Negative for congestion and ear pain.   Eyes: Negative for discharge and itching.  Respiratory: Negative for chest tightness and shortness of breath.   Cardiovascular: Negative for chest pain and palpitations.  Gastrointestinal: Positive for abdominal pain. Negative for abdominal distention.  Genitourinary: Positive for vaginal bleeding. Negative for difficulty urinating, dysuria and vaginal discharge.  Musculoskeletal: Negative for arthralgias and back pain.   Physical Exam   Blood pressure (!) 154/104, pulse 83, temperature 98.3 F (36.8 C), temperature source Oral, resp. rate 20, weight 274 lb 12 oz (124.6 kg), last menstrual period 03/06/2017, SpO2 98 %.  Physical Exam  Constitutional: She is oriented to person, place, and time. She appears well-developed and well-nourished. No distress.  HENT:  Head: Normocephalic and atraumatic.  Eyes: Pupils are equal, round, and reactive to light. Conjunctivae and  EOM are normal.  Neck: Normal range of motion. Neck supple.  Cardiovascular: Normal rate and intact distal pulses.   GI: Soft. There is no tenderness. There is no rebound and no guarding.  Genitourinary: Vagina normal.  Musculoskeletal: Normal range of motion. She exhibits no edema.  Neurological: She is alert and oriented to person, place, and time. No cranial nerve deficit.  Skin: Skin is warm and dry. No erythema.  Psychiatric:  Crying during exam     MAU Course  Procedures  MDM Will get cbc. Will not repeat CT or US at this time. There is no blood on inspection of vagina, so  will likely need outpatient follow up for further workup.  Assessment and Plan  1.vaginal bleeding- H/H wnl. Advised patient to follow up with PCP for further outpatient evaluation  Karam Dunson 04/17/2017, 1:01 PM

## 2017-04-17 NOTE — MAU Note (Addendum)
Having severe abd pain (off and on since mid Sept).  Been bleeding since 09/10, has been from heavy to spotting, finally stopped on Frid.  0100, started spotting again.  Pain kicked up again yesterday. [facility] [place] was instructed to come here.

## 2017-04-17 NOTE — MAU Note (Signed)
Pt presents with c/o VB since September 10, states bleeding stopped approximately 4 days ago but began as spotting again today.  Denies use of birth control, had BTL in 2016.  Pt also reports lower abdominal pain best described like "labor pains".

## 2017-04-17 NOTE — Discharge Instructions (Signed)
Dysfunctional Uterine Bleeding °Dysfunctional uterine bleeding is abnormal bleeding from the uterus. Dysfunctional uterine bleeding includes: °· A period that comes earlier or later than usual. °· A period that is lighter, heavier, or has blood clots. °· Bleeding between periods. °· Skipping one or more periods. °· Bleeding after sexual intercourse. °· Bleeding after menopause. ° °Follow these instructions at home: °Pay attention to any changes in your symptoms. Follow these instructions to help with your condition: °Eating and drinking °· Eat well-balanced meals. Include foods that are high in iron, such as liver, meat, shellfish, green leafy vegetables, and eggs. °· If you become constipated: °? Drink plenty of water. °? Eat fruits and vegetables that are high in water and fiber, such as spinach, carrots, raspberries, apples, and mango. °Medicines °· Take over-the-counter and prescription medicines only as told by your health care provider. °· Do not change medicines without talking with your health care provider. °· Aspirin or medicines that contain aspirin may make the bleeding worse. Do not take those medicines: °? During the week before your period. °? During your period. °· If you were prescribed iron pills, take them as told by your health care provider. Iron pills help to replace iron that your body loses because of this condition. °Activity °· If you need to change your sanitary pad or tampon more than one time every 2 hours: °? Lie in bed with your feet raised (elevated). °? Place a cold pack on your lower abdomen. °? Rest as much as possible until the bleeding stops or slows down. °· Do not try to lose weight until the bleeding has stopped and your blood iron level is back to normal. °Other Instructions °· For two months, write down: °? When your period starts. °? When your period ends. °? When any abnormal bleeding occurs. °? What problems you notice. °· Keep all follow up visits as told by your health  care provider. This is important. °Contact a health care provider if: °· You get light-headed or weak. °· You have nausea and vomiting. °· You cannot eat or drink without vomiting. °· You feel dizzy or have diarrhea while you are taking medicines. °· You are taking birth control pills or hormones, and you want to change them or stop taking them. °Get help right away if: °· You develop a fever or chills. °· You need to change your sanitary pad or tampon more than one time per hour. °· Your bleeding becomes heavier, or your flow contains clots more often. °· You develop pain in your abdomen. °· You lose consciousness. °· You develop a rash. °This information is not intended to replace advice given to you by your health care provider. Make sure you discuss any questions you have with your health care provider. °Document Released: 06/10/2000 Document Revised: 11/19/2015 Document Reviewed: 09/08/2014 °Elsevier Interactive Patient Education © 2018 Elsevier Inc. ° °

## 2019-01-24 DIAGNOSIS — E282 Polycystic ovarian syndrome: Secondary | ICD-10-CM | POA: Insufficient documentation

## 2020-02-10 ENCOUNTER — Encounter: Payer: Medicaid Other | Admitting: Obstetrics and Gynecology

## 2020-02-28 ENCOUNTER — Other Ambulatory Visit: Payer: Self-pay

## 2020-02-28 ENCOUNTER — Encounter: Payer: Self-pay | Admitting: Family Medicine

## 2020-02-28 ENCOUNTER — Ambulatory Visit: Payer: Medicaid Other | Admitting: Family Medicine

## 2020-02-28 ENCOUNTER — Other Ambulatory Visit (HOSPITAL_COMMUNITY)
Admission: RE | Admit: 2020-02-28 | Discharge: 2020-02-28 | Disposition: A | Discharge status: Home / Self Care | Payer: Medicaid Other | Source: Ambulatory Visit | Attending: Family Medicine | Admitting: Family Medicine

## 2020-02-28 VITALS — BP 124/81 | HR 76 | Ht 63.0 in | Wt 257.0 lb

## 2020-02-28 DIAGNOSIS — Z113 Encounter for screening for infections with a predominantly sexual mode of transmission: Secondary | ICD-10-CM | POA: Diagnosis not present

## 2020-02-28 DIAGNOSIS — N920 Excessive and frequent menstruation with regular cycle: Secondary | ICD-10-CM | POA: Diagnosis not present

## 2020-02-28 DIAGNOSIS — Z124 Encounter for screening for malignant neoplasm of cervix: Secondary | ICD-10-CM | POA: Diagnosis present

## 2020-02-28 DIAGNOSIS — Z01419 Encounter for gynecological examination (general) (routine) without abnormal findings: Secondary | ICD-10-CM

## 2020-02-28 MED ORDER — DOXYCYCLINE HYCLATE 100 MG PO CAPS: 100.0000 mg | ORAL_CAPSULE | Freq: Two times a day (BID) | ORAL | 0 refills | Status: DC

## 2020-02-28 NOTE — Progress Notes (Signed)
Pt c/o heavy, painful periods Pt request STD testing

## 2020-02-28 NOTE — Patient Instructions (Addendum)
Menorrhagia  Menorrhagia is a condition in which menstrual periods are heavy or last longer than normal. With menorrhagia, most periods a woman has may cause enough blood loss and cramping that she becomes unable to take part in her usual activities. What are the causes? Common causes of this condition include:  Noncancerous growths in the uterus (polyps or fibroids).  An imbalance of the estrogen and progesterone hormones.  One of the ovaries not releasing an egg during one or more months.  A problem with the thyroid gland (hypothyroid).  Side effects of having an intrauterine device (IUD).  Side effects of some medicines, such as anti-inflammatory medicines or blood thinners.  A bleeding disorder that stops the blood from clotting normally. In some cases, the cause of this condition is not known. What are the signs or symptoms? Symptoms of this condition include:  Routinely having to change your pad or tampon every 1-2 hours because it is completely soaked.  Needing to use pads and tampons at the same time because of heavy bleeding.  Needing to wake up to change your pads or tampons during the night.  Passing blood clots larger than 1 inch (2.5 cm) in size.  Having bleeding that lasts for more than 7 days.  Having symptoms of low iron levels (anemia), such as tiredness, fatigue, or shortness of breath. How is this diagnosed? This condition may be diagnosed based on:  A physical exam.  Your symptoms and menstrual history.  Tests, such as: ? Blood tests to check if you are pregnant or have hormonal changes, a bleeding or thyroid disorder, anemia, or other problems. ? Pap test to check for cancerous changes, infections, or inflammation. ? Endometrial biopsy. This test involves removing a tissue sample from the lining of the uterus (endometrium) to be examined under a microscope. ? Pelvic ultrasound. This test uses sound waves to create images of your uterus, ovaries, and  vagina. The images can show if you have fibroids or other growths. ? Hysteroscopy. For this test, a small telescope is used to look inside your uterus. How is this treated? Treatment may not be needed for this condition. If it is needed, the best treatment for you will depend on:  Whether you need to prevent pregnancy.  Your desire to have children in the future.  The cause and severity of your bleeding.  Your personal preference. Medicines are the first step in treatment. You may be treated with:  Hormonal birth control methods. These treatments reduce bleeding during your menstrual period. They include: ? Birth control pills. ? Skin patch. ? Vaginal ring. ? Shots (injections) that you get every 3 months. ? Hormonal IUD (intrauterine device). ? Implants that go under the skin.  Medicines that thicken blood and slow bleeding.  Medicines that reduce swelling, such as ibuprofen.  Medicines that contain an artificial (synthetic) hormone called progestin.  Medicines that make the ovaries stop working for a short time.  Iron supplements to treat anemia. If medicines do not work, surgery may be done. Surgical options may include:  Dilation and curettage (D&C). In this procedure, your health care provider opens (dilates) your cervix and then scrapes or suctions tissue from the endometrium to reduce menstrual bleeding.  Operative hysteroscopy. In this procedure, a small tube with a light on the end (hysteroscope) is used to view your uterus and help remove polyps that may be causing heavy periods.  Endometrial ablation. This is when various techniques are used to permanently destroy your entire endometrium.   After endometrial ablation, most women have little or no menstrual flow. This procedure reduces your ability to become pregnant.  Endometrial resection. In this procedure, an electrosurgical wire loop is used to remove the endometrium. This procedure reduces your ability to become  pregnant.  Hysterectomy. This is surgical removal of the uterus. This is a permanent procedure that stops menstrual periods. Pregnancy is not possible after a hysterectomy. Follow these instructions at home: Medicines  Take over-the-counter and prescription medicines exactly as told by your health care provider. This includes iron pills.  Do not change or switch medicines without asking your health care provider.  Do not take aspirin or medicines that contain aspirin 1 week before or during your menstrual period. Aspirin may make bleeding worse. General instructions  If you need to change your sanitary pad or tampon more than once every 2 hours, limit your activity until the bleeding stops.  Iron pills can cause constipation. To prevent or treat constipation while you are taking prescription iron supplements, your health care provider may recommend that you: ? Drink enough fluid to keep your urine clear or pale yellow. ? Take over-the-counter or prescription medicines. ? Eat foods that are high in fiber, such as fresh fruits and vegetables, whole grains, and beans. ? Limit foods that are high in fat and processed sugars, such as fried and sweet foods.  Eat well-balanced meals, including foods that are high in iron. Foods that have a lot of iron include leafy green vegetables, meat, liver, eggs, and whole grain breads and cereals.  Do not try to lose weight until the abnormal bleeding has stopped and your blood iron level is back to normal. If you need to lose weight, work with your health care provider to lose weight safely.  Keep all follow-up visits as told by your health care provider. This is important. Contact a health care provider if:  You soak through a pad or tampon every 1 or 2 hours, and this happens every time you have a period.  You need to use pads and tampons at the same time because you are bleeding so much.  You have nausea, vomiting, diarrhea, or other problems  related to medicines you are taking. Get help right away if:  You soak through more than a pad or tampon in 1 hour.  You pass clots bigger than 1 inch (2.5 cm) wide.  You feel short of breath.  You feel like your heart is beating too fast.  You feel dizzy or faint.  You feel very weak or tired. Summary  Menorrhagia is a condition in which menstrual periods are heavy or last longer than normal.  Treatment will depend on the cause of the condition and may include medicines or procedures.  Take over-the-counter and prescription medicines exactly as told by your health care provider. This includes iron pills.  Get help right away if you have heavy bleeding that soaks through more than a pad or tampon in 1 hour, you are passing large clots, or you feel dizzy, faint or short of breath. This information is not intended to replace advice given to you by your health care provider. Make sure you discuss any questions you have with your health care provider. Document Revised: 09/20/2017 Document Reviewed: 06/06/2016 Elsevier Patient Education  2020 Elsevier Inc.   Preventive Care 21-39 Years Old, Female Preventive care refers to visits with your health care provider and lifestyle choices that can promote health and wellness. This includes:  A yearly physical   exam. This may also be called an annual well check.  Regular dental visits and eye exams.  Immunizations.  Screening for certain conditions.  Healthy lifestyle choices, such as eating a healthy diet, getting regular exercise, not using drugs or products that contain nicotine and tobacco, and limiting alcohol use. What can I expect for my preventive care visit? Physical exam Your health care provider will check your:  Height and weight. This may be used to calculate body mass index (BMI), which tells if you are at a healthy weight.  Heart rate and blood pressure.  Skin for abnormal spots. Counseling Your health care  provider may ask you questions about your:  Alcohol, tobacco, and drug use.  Emotional well-being.  Home and relationship well-being.  Sexual activity.  Eating habits.  Work and work environment.  Method of birth control.  Menstrual cycle.  Pregnancy history. What immunizations do I need?  Influenza (flu) vaccine  This is recommended every year. Tetanus, diphtheria, and pertussis (Tdap) vaccine  You may need a Td booster every 10 years. Varicella (chickenpox) vaccine  You may need this if you have not been vaccinated. Human papillomavirus (HPV) vaccine  If recommended by your health care provider, you may need three doses over 6 months. Measles, mumps, and rubella (MMR) vaccine  You may need at least one dose of MMR. You may also need a second dose. Meningococcal conjugate (MenACWY) vaccine  One dose is recommended if you are age 19-21 years and a first-year college student living in a residence hall, or if you have one of several medical conditions. You may also need additional booster doses. Pneumococcal conjugate (PCV13) vaccine  You may need this if you have certain conditions and were not previously vaccinated. Pneumococcal polysaccharide (PPSV23) vaccine  You may need one or two doses if you smoke cigarettes or if you have certain conditions. Hepatitis A vaccine  You may need this if you have certain conditions or if you travel or work in places where you may be exposed to hepatitis A. Hepatitis B vaccine  You may need this if you have certain conditions or if you travel or work in places where you may be exposed to hepatitis B. Haemophilus influenzae type b (Hib) vaccine  You may need this if you have certain conditions. You may receive vaccines as individual doses or as more than one vaccine together in one shot (combination vaccines). Talk with your health care provider about the risks and benefits of combination vaccines. What tests do I need?  Blood  tests  Lipid and cholesterol levels. These may be checked every 5 years starting at age 20.  Hepatitis C test.  Hepatitis B test. Screening  Diabetes screening. This is done by checking your blood sugar (glucose) after you have not eaten for a while (fasting).  Sexually transmitted disease (STD) testing.  BRCA-related cancer screening. This may be done if you have a family history of breast, ovarian, tubal, or peritoneal cancers.  Pelvic exam and Pap test. This may be done every 3 years starting at age 21. Starting at age 30, this may be done every 5 years if you have a Pap test in combination with an HPV test. Talk with your health care provider about your test results, treatment options, and if necessary, the need for more tests. Follow these instructions at home: Eating and drinking   Eat a diet that includes fresh fruits and vegetables, whole grains, lean protein, and low-fat dairy.  Take vitamin and   mineral supplements as recommended by your health care provider.  Do not drink alcohol if: ? Your health care provider tells you not to drink. ? You are pregnant, may be pregnant, or are planning to become pregnant.  If you drink alcohol: ? Limit how much you have to 0-1 drink a day. ? Be aware of how much alcohol is in your drink. In the [place] one drink equals one 12 oz bottle of beer (355 mL), one 5 oz glass of wine (148 mL), or one 1 oz glass of hard liquor (44 mL). Lifestyle  Take daily care of your teeth and gums.  Stay active. Exercise for at least 30 minutes on 5 or more days each week.  Do not use any products that contain nicotine or tobacco, such as cigarettes, e-cigarettes, and chewing tobacco. If you need help quitting, ask your health care provider.  If you are sexually active, practice safe sex. Use a condom or other form of birth control (contraception) in order to prevent pregnancy and STIs (sexually transmitted infections). If you plan to become pregnant, see  your health care provider for a preconception visit. What's next?  Visit your health care provider once a year for a well check visit.  Ask your health care provider how often you should have your eyes and teeth checked.  Stay up to date on all vaccines. This information is not intended to replace advice given to you by your health care provider. Make sure you discuss any questions you have with your health care provider. Document Revised: 02/22/2018 Document Reviewed: 02/22/2018 Elsevier Patient Education  2020 Elsevier Inc.  

## 2020-02-28 NOTE — Progress Notes (Signed)
Subjective:     Virginia Sanders is a 29 y.o. female and is here for abnormal bleeding. She also needs pap (none x 4 years). The patient reports problems - heavy bleeding. S/p BTL. After birth of last baby, cycles were monthly and lasted 3-4 days. Not having bloating, breast tenderness. Last 5 months, she is having bloating, significant breasst tenderness, heavy bleeding, soaking through tampons and pads. Having blood clots. Notes some passage of tissue, that is stringy and fleshlike. Cycles are now lasting 7 days. Also, having cramping pain which keeps her in bed during her cycles. Pain is continuing post cycle and is on the right. ? Whether she has an ovarian cyst. Takes Ibuprofen which does not really help. Also having migraines with her cycle. Also, having more discharge over the last month or so. Notes some fever, some nausea. New partner, wants STD screen.  The following portions of the patient's history were reviewed and updated as appropriate: allergies, current medications, past family history, past medical history, past social history, past surgical history and problem list.  Review of Systems Pertinent items noted in HPI and remainder of comprehensive ROS otherwise negative.   Objective:    BP 124/81   Pulse 76   Ht 5\' 3"  (1.6 m)   Wt 257 lb (116.6 kg)   LMP 02/19/2020   BMI 45.53 kg/m  General appearance: alert, cooperative, appears stated age and moderately obese Head: Normocephalic, without obvious abnormality, atraumatic Neck: no adenopathy, supple, symmetrical, trachea midline and thyroid not enlarged, symmetric, no tenderness/mass/nodules Lungs: clear to auscultation bilaterally Breasts: normal appearance, no masses or tenderness Heart: regular rate and rhythm, S1, S2 normal, no murmur, click, rub or gallop Abdomen: soft, non-tender; bowel sounds normal; no masses,  no organomegaly Pelvic: cervix normal in appearance, exam obscured by obesity, external genitalia normal,  positive findings: tender uterus or + CMT, rectovaginal septum normal, uterus normal size, shape, and consistency and vagina normal without discharge Extremities: extremities normal, atraumatic, no cyanosis or edema Pulses: 2+ and symmetric Skin: Skin color, texture, turgor normal. No rashes or lesions Lymph nodes: Cervical, supraclavicular, and axillary nodes normal. Neurologic: Grossly normal    Assessment:    Healthy female exam.      Plan:      Problem List Items Addressed This Visit      Unprioritized   Menorrhagia with regular cycle - Primary    Discussed causes. Check pelvic sono, at risk for adenomyosis given prior c-section x 2. Sister with recent hysterectomy. Check TSH, CBC. Treat for presumptive endometritis/PID given tenderness and fever. Treatment options reviewed briefly and will vary depending on cause, including oral meds (OC's or cyclic progestin), IUD, endometrial ablation (though age makes failure more likely), D & C and possible hysterectomy--risks reviewed as well as moratorium on inpatient surgeries during COVID surge.      Relevant Orders   TSH   CBC   02/21/2020 PELVIC COMPLETE WITH TRANSVAGINAL    Other Visit Diagnoses    Screening for malignant neoplasm of cervix       Relevant Orders   Cytology - PAP( [facility])   Encounter for gynecological examination without abnormal finding       Screen for STD (sexually transmitted disease)       New partner, + CMT, + fever check for infection--concern for PID   Relevant Orders   Cervicovaginal ancillary only( [place])   Hepatitis B surface antigen   Hepatitis C antibody   HIV Antibody (routine testing  w rflx)   RPR      See After Visit Summary for Counseling Recommendations

## 2020-02-28 NOTE — Assessment & Plan Note (Signed)
Discussed causes. Check pelvic sono, at risk for adenomyosis given prior c-section x 2. Sister with recent hysterectomy. Check TSH, CBC. Treat for presumptive endometritis/PID given tenderness and fever. Treatment options reviewed briefly and will vary depending on cause, including oral meds (OC's or cyclic progestin), IUD, endometrial ablation (though age makes failure more likely), D & C and possible hysterectomy--risks reviewed as well as moratorium on inpatient surgeries during COVID surge.

## 2020-03-03 LAB — CBC
HCT: 39.4 % (ref 35.0–45.0)
Hemoglobin: 13.3 g/dL (ref 11.7–15.5)
MCH: 30.6 pg (ref 27.0–33.0)
MCHC: 33.8 g/dL (ref 32.0–36.0)
MCV: 90.8 fL (ref 80.0–100.0)
MPV: 10.3 fL (ref 7.5–12.5)
Platelets: 309 10*3/uL (ref 140–400)
RBC: 4.34 10*6/uL (ref 3.80–5.10)
RDW: 12.3 % (ref 11.0–15.0)
WBC: 7.4 10*3/uL (ref 3.8–10.8)

## 2020-03-03 LAB — HIV ANTIBODY (ROUTINE TESTING W REFLEX): HIV 1&2 Ab, 4th Generation: NONREACTIVE

## 2020-03-03 LAB — HEPATITIS B SURFACE ANTIGEN: Hepatitis B Surface Ag: NONREACTIVE

## 2020-03-03 LAB — HEPATITIS C ANTIBODY
Hepatitis C Ab: NONREACTIVE
SIGNAL TO CUT-OFF: 0.01 (ref <1.00)

## 2020-03-03 LAB — TSH: TSH: 1.51 mIU/L

## 2020-03-03 LAB — RPR: RPR Ser Ql: NONREACTIVE

## 2020-03-04 ENCOUNTER — Other Ambulatory Visit: Payer: Self-pay

## 2020-03-04 ENCOUNTER — Ambulatory Visit (INDEPENDENT_AMBULATORY_CARE_PROVIDER_SITE_OTHER): Payer: Medicaid Other

## 2020-03-04 DIAGNOSIS — N920 Excessive and frequent menstruation with regular cycle: Secondary | ICD-10-CM

## 2020-03-06 LAB — CYTOLOGY - PAP
Adequacy: ABSENT
Chlamydia: NEGATIVE
Comment: NEGATIVE
Comment: NEGATIVE
Comment: NORMAL
Diagnosis: NEGATIVE
High risk HPV: NEGATIVE
Neisseria Gonorrhea: NEGATIVE

## 2020-03-10 ENCOUNTER — Encounter: Payer: Self-pay | Admitting: Family Medicine

## 2020-03-11 ENCOUNTER — Other Ambulatory Visit (HOSPITAL_BASED_OUTPATIENT_CLINIC_OR_DEPARTMENT_OTHER): Payer: Medicaid Other

## 2020-04-02 ENCOUNTER — Encounter: Payer: Self-pay | Admitting: Obstetrics and Gynecology

## 2020-04-02 ENCOUNTER — Ambulatory Visit: Payer: Medicaid Other | Admitting: Obstetrics and Gynecology

## 2020-04-02 ENCOUNTER — Other Ambulatory Visit: Payer: Self-pay

## 2020-04-02 VITALS — BP 122/83 | HR 79 | Resp 16 | Ht 63.0 in | Wt 257.0 lb

## 2020-04-02 DIAGNOSIS — N920 Excessive and frequent menstruation with regular cycle: Secondary | ICD-10-CM | POA: Diagnosis not present

## 2020-04-02 MED ORDER — MEDROXYPROGESTERONE ACETATE 10 MG PO TABS: 20.0000 mg | ORAL_TABLET | Freq: Every day | ORAL | 2 refills | Status: DC

## 2020-04-02 NOTE — Progress Notes (Signed)
GYNECOLOGY OFFICE FOLLOW UP NOTE  History:  29 y.o. [id number] here today for follow up for heavy bleeding. This has been much worse the last 4 months. Periods are extremely heavy, she has cramping throughout the month. Gets nauseous, has bloating. States she looks "6-7 months pregnant" when she is bloated.    Past Medical History:  Diagnosis Date  . Abnormal Pap smear of cervix    Colpo  . Anxiety   . Bipolar 1 disorder (HCC)   . Bipolar 1 disorder (HCC)   . Bipolar disorder (HCC)   . Depression    Bipolar  . OCD (obsessive compulsive disorder)   . Personality disorder [facility])     Past Surgical History:  Procedure Laterality Date  . BLADDER REPAIR W/ CESAREAN SECTION    . BUNIONECTOMY Bilateral   . CESAREAN SECTION    . CESAREAN SECTION N/A 09/02/2014   Procedure: CESAREAN SECTION;  Surgeon: Levie Heritage, DO;  Location: [facility];  Service: Obstetrics;  Laterality: N/A;  . CHOLECYSTECTOMY    . TUBAL LIGATION    . WISDOM TOOTH EXTRACTION      Current Outpatient Medications:  .  buPROPion (WELLBUTRIN SR) 150 MG 12 hr tablet, Take 150 mg by mouth 2 (two) times daily., Disp: , Rfl:  .  clonazePAM (KLONOPIN) 0.1 mg/mL SUSP, Take by mouth., Disp: , Rfl:  .  metFORMIN (GLUCOPHAGE) 500 MG tablet, Take by mouth., Disp: , Rfl:  .  spironolactone (ALDACTONE) 25 MG tablet, Take 25 mg by mouth daily., Disp: , Rfl:  .  traZODone (DESYREL) 100 MG tablet, SMARTSIG:1 Tablet(s) By Mouth Every Evening, Disp: , Rfl:  .  VRAYLAR capsule, Take 3 mg by mouth daily., Disp: , Rfl:  .  lamoTRIgine (LAMICTAL) 150 MG tablet, Take 300 mg by mouth daily. (Patient not taking: Reported on 04/02/2020), Disp: , Rfl:  .  medroxyPROGESTERone (PROVERA) 10 MG tablet, Take 2 tablets (20 mg total) by mouth daily., Disp: 30 tablet, Rfl: 2  The following portions of the patient's history were reviewed and updated as appropriate: allergies, current medications, past family history, past medical history, past social  history, past surgical history and problem list.   Review of Systems:  Pertinent items noted in HPI and remainder of comprehensive ROS otherwise negative.   Objective:  Physical Exam BP 122/83   Pulse 79   Resp 16   Ht 5\' 3"  (1.6 m)   Wt 257 lb (116.6 kg)   LMP 03/11/2020   BMI 45.53 kg/m  CONSTITUTIONAL: Well-developed, well-nourished female in no acute distress.  HENT:  Normocephalic, atraumatic. External right and left ear normal. Oropharynx is clear and moist EYES: Conjunctivae and EOM are normal. Pupils are equal, round, and reactive to light. No scleral icterus.  NECK: Normal range of motion, supple, no masses SKIN: Skin is warm and dry. No rash noted. Not diaphoretic. No erythema. No pallor. NEUROLOGIC: Alert and oriented to person, place, and time. Normal reflexes, muscle tone coordination. No cranial nerve deficit noted. PSYCHIATRIC: Normal mood and affect. Normal behavior. Normal judgment and thought content. CARDIOVASCULAR: Normal heart rate noted RESPIRATORY: Effort normal, no problems with respiration noted ABDOMEN: Soft, no distention noted.   PELVIC: deferred MUSCULOSKELETAL: Normal range of motion. No edema noted.  Labs and Imaging 03/13/2020 PELVIC COMPLETE WITH TRANSVAGINAL  Result Date: 03/04/2020 CLINICAL DATA:  Menorrhagia with regular cycle, LMP 02/19/2020, history tubal ligation and prior Caesarean section x2 EXAM: TRANSABDOMINAL AND TRANSVAGINAL ULTRASOUND OF PELVIS TECHNIQUE: Both transabdominal and  transvaginal ultrasound examinations of the pelvis were performed. Transabdominal technique was performed for global imaging of the pelvis including uterus, ovaries, adnexal regions, and pelvic cul-de-sac. It was necessary to proceed with endovaginal exam following the transabdominal exam to visualize the uterus, endometrium, and ovaries. COMPARISON:  None FINDINGS: Uterus Measurements: 9.7 x 3.6 x 4.7 cm = volume: 86 mL. Anteverted. Small fluid collection at anterior wall  at site of prior Caesarean section. No focal uterine mass. Endometrium Thickness: 9 mm. Small amount of nonspecific endometrial fluid at fundal portion of endometrial canal. No focal mass. Right ovary Measurements: 2.9 x 1.9 x 2.4 cm = volume: 7 mL. Normal morphology without mass Left ovary Measurements: 2.3 x 1.3 x 1.6 cm = volume: 3 mL. Normal morphology without mass Other findings No free pelvic fluid.  No adnexal masses. IMPRESSION: Small amount of nonspecific endometrial fluid. Otherwise negative exam. Electronically Signed   By: Ulyses Southward M.D.   On: 03/04/2020 16:08    Assessment & Plan:   1. Menorrhagia with regular cycle - reviewed normal labs and ultrasound - Pt has tried OCPs, IUD, nuvaring, the patch. Had bad hormonal side effects (mood swings) with all of them. She reports the IUD came out on its own and had an infection that she had to be treated for due to this - reviewed that ablation would not improve her hormonal symptoms although it could potentially improve bleeding - reviewed non-hormonal management with lysteda - she has had tubal ligation and is not worried about fertility - recommended she start with progesterone and see if that improves symptoms - provera sent to pharmacy  Routine preventative health maintenance measures emphasized. Please refer to After Visit Summary for other counseling recommendations.   Return in about 3 months (around 07/03/2020) for in person, Followup.  Total face-to-face time with patient: 22 minutes. Over 50% of encounter was spent on counseling and coordination of care.  Baldemar Lenis, M.D. Attending Center for [facility])

## 2020-05-07 ENCOUNTER — Telehealth: Payer: Self-pay | Admitting: *Deleted

## 2020-05-07 NOTE — Telephone Encounter (Signed)
Left patient a message to call and schedule 3 month F/U appointment with Dr. Earlene Plater in office, in January.

## 2020-06-17 ENCOUNTER — Telehealth: Payer: Self-pay | Admitting: *Deleted

## 2020-06-17 NOTE — Telephone Encounter (Signed)
Called patient x 2, busy signal

## 2021-05-27 ENCOUNTER — Ambulatory Visit: Payer: Medicaid Other | Admitting: Obstetrics and Gynecology

## 2021-05-27 ENCOUNTER — Other Ambulatory Visit (HOSPITAL_COMMUNITY)
Admission: RE | Admit: 2021-05-27 | Discharge: 2021-05-27 | Disposition: A | Discharge status: Home / Self Care | Payer: Medicaid Other | Source: Ambulatory Visit | Attending: Obstetrics and Gynecology | Admitting: Obstetrics and Gynecology

## 2021-05-27 ENCOUNTER — Encounter: Payer: Self-pay | Admitting: Obstetrics and Gynecology

## 2021-05-27 ENCOUNTER — Other Ambulatory Visit: Payer: Self-pay

## 2021-05-27 ENCOUNTER — Telehealth: Payer: Self-pay | Admitting: *Deleted

## 2021-05-27 VITALS — BP 127/83 | HR 93 | Ht 63.0 in | Wt 231.0 lb

## 2021-05-27 DIAGNOSIS — Z113 Encounter for screening for infections with a predominantly sexual mode of transmission: Secondary | ICD-10-CM

## 2021-05-27 DIAGNOSIS — R232 Flushing: Secondary | ICD-10-CM

## 2021-05-27 DIAGNOSIS — E282 Polycystic ovarian syndrome: Secondary | ICD-10-CM

## 2021-05-27 DIAGNOSIS — R87619 Unspecified abnormal cytological findings in specimens from cervix uteri: Secondary | ICD-10-CM

## 2021-05-27 DIAGNOSIS — Z9851 Tubal ligation status: Secondary | ICD-10-CM | POA: Diagnosis not present

## 2021-05-27 DIAGNOSIS — B1920 Unspecified viral hepatitis C without hepatic coma: Secondary | ICD-10-CM

## 2021-05-27 NOTE — Progress Notes (Signed)
GYNECOLOGY OFFICE VISIT NOTE  History:   Virginia Sanders is a 30 y.o. [phone] here today for hormonal issues and STI screening.   For hormonal issues: She is having hot flashes and sweating and this is main concern. This has been going on for some time. She has an appointment next week with her PCP.   She has lost weight intentionally and from stress.   She does have known PCOS but doesn't think it is that. She has been having regular cycles and they have been lighter lately.   She has a partner - he said he "got something from her". He couldn't tell her what.   Tubal for birth control.   She denies any abnormal vaginal discharge, bleeding, pelvic pain or other concerns.    Past Medical History:  Diagnosis Date   Abnormal Pap smear of cervix    Colpo   Anxiety    Bipolar 1 disorder (HCC)    Bipolar 1 disorder (HCC)    Bipolar disorder (HCC)    Depression    Bipolar   OCD (obsessive compulsive disorder)    Personality disorder (HCC)     Past Surgical History:  Procedure Laterality Date   BLADDER REPAIR W/ CESAREAN SECTION     BUNIONECTOMY Bilateral    CESAREAN SECTION     CESAREAN SECTION N/A 09/02/2014   Procedure: CESAREAN SECTION;  Surgeon: Levie Heritage, DO;  Location: [facility];  Service: Obstetrics;  Laterality: N/A;   CHOLECYSTECTOMY     TUBAL LIGATION     WISDOM TOOTH EXTRACTION      The following portions of the patient's history were reviewed and updated as appropriate: allergies, current medications, past family history, past medical history, past social history, past surgical history and problem list.   Health Maintenance:   Diagnosis  Date Value Ref Range Status  02/28/2020   Final   - Negative for intraepithelial lesion or malignancy (NILM)     Review of Systems:  Pertinent items noted in HPI and remainder of comprehensive ROS otherwise negative.  Physical Exam:  BP 127/83   Pulse 93   Ht 5\' 3"  (1.6 m)   Wt 231 lb (104.8 kg)   LMP 05/11/2021    BMI 40.92 kg/m  CONSTITUTIONAL: Well-developed, well-nourished female in no acute distress.  HEENT:  Normocephalic, atraumatic. External right and left ear normal. No scleral icterus.  NECK: Normal range of motion, supple, no masses noted on observation SKIN: No rash noted. Not diaphoretic. No erythema. No pallor. MUSCULOSKELETAL: Normal range of motion. No edema noted. NEUROLOGIC: Alert and oriented to person, place, and time. Normal muscle tone coordination. No cranial nerve deficit noted. PSYCHIATRIC: Normal mood and affect. Normal behavior. Normal judgment and thought content.  CARDIOVASCULAR: Normal heart rate noted RESPIRATORY: Effort and breath sounds normal, no problems with respiration noted ABDOMEN: No masses noted. No other overt distention noted.    PELVIC: Deferred  Labs and Imaging No results found for this or any previous visit (from the past 168 hour(s)). No results found.  Assessment and Plan:   Lavine was seen today for std check.  Diagnoses and all orders for this visit:  PCOS (polycystic ovarian syndrome) - Discussed diagnosis of PCOS and on what basis. Reviewed long term implication of issues with weight loss, metabolic syndrome - Labwork for FLP and HgBA1C: Due - new Rx given - Reviewed possible help of Myoinositiol with weight loss. Offered referral to dietician. She Accepts.  - Discussed importance of endometrial  protection with different hormonal methods to prevent hyperplasia/malignancy in the setting of oligomenorrhea.  -     HgB A1c -     Lipid Profile -     Ambulatory Referral to DSME/T  Abnormal cervical Papanicolaou smear, unspecified abnormal pap finding Was remote history - more recent paps are normal in 2018 and 2021. Pap in 2021 was pap normal and HPV negative.  S/p gardasil vaccine.   S/P tubal ligation  Routine screening for STI (sexually transmitted infection) - Cultures done today and blood work rx given -     Cervicovaginal  ancillary only( [place]) -     HIV antibody (with reflex) -     Hepatitis C Antibody -     Hepatitis B Surface AntiGEN -     RPR  Hot flashes - Will check for all etiologies. Discussed all tests with patient.  -     CBC -     TSH -     Prolactin -     Estradiol -     FSH -     LH -     Cortisol  Routine preventative health maintenance measures emphasized. Please refer to After Visit Summary for other counseling recommendations.   Return in about 6 months (around 11/25/2021) for annual.  Milas Hock, MD, FACOG Obstetrician & Gynecologist, [facility] for [facility], [facility]

## 2021-05-27 NOTE — Telephone Encounter (Signed)
Called patient x 2 to see if she would like to come in at 3:30 instead of 3:50 PM, no answer and no voicemail.

## 2021-05-28 LAB — CERVICOVAGINAL ANCILLARY ONLY
Chlamydia: NEGATIVE
Comment: NEGATIVE
Comment: NEGATIVE
Comment: NORMAL
Neisseria Gonorrhea: NEGATIVE
Trichomonas: POSITIVE — AB

## 2021-05-29 LAB — CBC
HCT: 43.9 % (ref 35.0–45.0)
Hemoglobin: 14.8 g/dL (ref 11.7–15.5)
MCH: 30.7 pg (ref 27.0–33.0)
MCHC: 33.7 g/dL (ref 32.0–36.0)
MCV: 91.1 fL (ref 80.0–100.0)
MPV: 10.9 fL (ref 7.5–12.5)
Platelets: 256 10*3/uL (ref 140–400)
RBC: 4.82 10*6/uL (ref 3.80–5.10)
RDW: 12.2 % (ref 11.0–15.0)
WBC: 14.6 10*3/uL — ABNORMAL HIGH (ref 3.8–10.8)

## 2021-05-29 LAB — ESTRADIOL: Estradiol: 109 pg/mL

## 2021-05-29 LAB — TSH: TSH: 4.13 mIU/L

## 2021-05-29 LAB — CORTISOL: Cortisol, Plasma: 20.6 ug/dL

## 2021-05-29 LAB — FOLLICLE STIMULATING HORMONE: FSH: 3.5 m[IU]/mL

## 2021-05-29 LAB — PROLACTIN: Prolactin: 11.5 ng/mL

## 2021-05-29 LAB — LUTEINIZING HORMONE: LH: 1.9 m[IU]/mL

## 2021-05-30 MED ORDER — METRONIDAZOLE 500 MG PO TABS: ORAL_TABLET | ORAL | 0 refills | Status: DC

## 2021-05-30 NOTE — Addendum Note (Signed)
Addended by: Milas Hock A on: 05/30/2021 09:23 AM   Modules accepted: Orders

## 2021-05-31 ENCOUNTER — Telehealth: Payer: Self-pay

## 2021-05-31 NOTE — Telephone Encounter (Signed)
Pt called office requesting results. Attempted to call pt back. Pt did not answer.

## 2021-06-01 ENCOUNTER — Telehealth: Payer: Self-pay

## 2021-06-01 NOTE — Telephone Encounter (Signed)
Spoke with pt and she is aware of Trich results and she is aware to get Rx from pharmacy. Pt was told to refrain from intercourse until her and partner are treated and cleared of infection. Pt expressed understanding. Pt is also aware of elevated lipids and that modified diet will help. Pt will discuss with dietician and f/u with PCP about elevated white count.

## 2021-06-02 ENCOUNTER — Telehealth: Payer: Self-pay | Admitting: *Deleted

## 2021-06-02 ENCOUNTER — Other Ambulatory Visit: Payer: Self-pay | Admitting: *Deleted

## 2021-06-02 DIAGNOSIS — B1920 Unspecified viral hepatitis C without hepatic coma: Secondary | ICD-10-CM

## 2021-06-02 NOTE — Telephone Encounter (Signed)
-----   Message from Milas Hock, MD sent at 06/02/2021  8:18 AM EST ----- Please let Virginia Sanders know her initial STD testing from her blood work shows positive for hepatitis C. I would recommend we add on an HCV RNA level and then please refer her to infectious disease specialist. Please let her know following up with the specialist will ensure she is treated and cured.  Thanks, Dr. Para March

## 2021-06-02 NOTE — Progress Notes (Signed)
Referral place for ID due to positive Hep C

## 2021-06-02 NOTE — Telephone Encounter (Signed)
My chart message sent to patient to call office to discuss her recent lab results.  She will be referral to ID for Hep C

## 2021-06-04 LAB — HIV ANTIBODY (ROUTINE TESTING W REFLEX): HIV 1&2 Ab, 4th Generation: NONREACTIVE

## 2021-06-04 LAB — LIPID PANEL
Cholesterol: 203 mg/dL — ABNORMAL HIGH (ref <200)
HDL: 62 mg/dL (ref >=50)
LDL Cholesterol (Calc): 124 mg/dL (calc) — ABNORMAL HIGH
Non-HDL Cholesterol (Calc): 141 mg/dL (calc) — ABNORMAL HIGH (ref <130)
Total CHOL/HDL Ratio: 3.3 (calc) (ref <5.0)
Triglycerides: 76 mg/dL (ref <150)

## 2021-06-04 LAB — HEPATITIS C RNA QUANTITATIVE
HCV Quantitative Log: 5.19 log IU/mL — ABNORMAL HIGH
HCV RNA, PCR, QN: 155000 IU/mL — ABNORMAL HIGH

## 2021-06-04 LAB — HEMOGLOBIN A1C
Hgb A1c MFr Bld: 5.4 % of total Hgb (ref <5.7)
Mean Plasma Glucose: 108 mg/dL
eAG (mmol/L): 6 mmol/L

## 2021-06-04 LAB — HEPATITIS C ANTIBODY
Hepatitis C Ab: REACTIVE — AB
SIGNAL TO CUT-OFF: >11 — ABNORMAL HIGH (ref <1.00)

## 2021-06-04 LAB — RPR: RPR Ser Ql: NONREACTIVE

## 2021-06-04 LAB — HCV RNA,QUANTITATIVE REAL TIME PCR
HCV Quantitative Log: 4.78 Log IU/mL — ABNORMAL HIGH
HCV RNA, PCR, QN: 60200 IU/mL — ABNORMAL HIGH

## 2021-06-04 LAB — HEPATITIS B SURFACE ANTIGEN: Hepatitis B Surface Ag: NONREACTIVE

## 2021-06-08 ENCOUNTER — Encounter: Payer: Self-pay | Admitting: *Deleted

## 2021-06-08 NOTE — Addendum Note (Signed)
Addended by: Milas Hock A on: 06/08/2021 09:51 AM   Modules accepted: Orders

## 2021-06-10 ENCOUNTER — Telehealth: Payer: Self-pay | Admitting: *Deleted

## 2021-06-10 ENCOUNTER — Encounter: Payer: Self-pay | Admitting: *Deleted

## 2021-06-10 NOTE — Telephone Encounter (Signed)
-----   Message from Milas Hock, MD sent at 06/08/2021  9:51 AM EST ----- Just FYI

## 2021-06-10 NOTE — Telephone Encounter (Signed)
I have attempted to contact pt via my chart and phone without success.  Letter mailed to her home address requesting that she call the office to discuss her labs.

## 2021-11-26 ENCOUNTER — Emergency Department (INDEPENDENT_AMBULATORY_CARE_PROVIDER_SITE_OTHER)
Admission: EM | Admit: 2021-11-26 | Discharge: 2021-11-26 | Disposition: A | Discharge status: Home / Self Care | Payer: Medicaid Other | Source: Home / Self Care

## 2021-11-26 DIAGNOSIS — N898 Other specified noninflammatory disorders of vagina: Secondary | ICD-10-CM

## 2021-11-26 MED ORDER — DOXYCYCLINE HYCLATE 100 MG PO CAPS: 100.0000 mg | ORAL_CAPSULE | Freq: Two times a day (BID) | ORAL | 0 refills | Status: AC

## 2021-11-26 MED ORDER — TRAMADOL HCL 50 MG PO TABS: 50.0000 mg | ORAL_TABLET | Freq: Every day | ORAL | 0 refills | Status: DC | PRN

## 2021-11-26 NOTE — Discharge Instructions (Addendum)
Patient to take medications as directed with food to completion.  Advised may use Tramadol for breakthrough pain daily, as needed.  Encouraged patient to increase daily water intake while taking these medications.  Advised patient to follow-up with GYN on Monday, 11/29/21 for further evaluation.
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
## 2021-11-26 NOTE — ED Triage Notes (Signed)
Pt states that she has a cyst on the inside of her vagina. X2 days  Pt states that she has had one in the past and sometimes they will drain on their own. Pt states that she did call her obgyn which is closed.

## 2021-12-02 ENCOUNTER — Telehealth: Payer: Self-pay | Admitting: *Deleted

## 2021-12-02 ENCOUNTER — Ambulatory Visit: Payer: Medicaid Other | Admitting: Family Medicine

## 2021-12-02 NOTE — Telephone Encounter (Signed)
Returned call from 1:34 PM. Left patient a message to call to reschedule. Patient had to work over.

## 2021-12-13 ENCOUNTER — Ambulatory Visit: Payer: Medicaid Other | Admitting: Obstetrics & Gynecology

## 2022-01-03 ENCOUNTER — Ambulatory Visit: Payer: Medicaid Other | Admitting: Obstetrics & Gynecology

## 2022-12-29 NOTE — Progress Notes (Signed)
GYNECOLOGY OFFICE VISIT NOTE  History:   Shiela Zavalza is a 32 y.o. [phone] here today for follow up from ED for a vaginal cyst. She has had this before and had it drained. She then felt like it got infected and went to the ED and had it drained and was given ABX. It now feels much better.   She also notes her cycles come usually regularly but sometimes close together where she gets two periods in one month. She notes hot flashes. She reports her periods are heavy, lasting 5-7 days and the heaviest days are on days 2-4. She has clots that are stringy when she gets them. Her periods are painful when she gets them. She has a history of 2 prior c-sections.    She has tried hormonal therapy in the past but has not been able to tolerate them well due to other medications and other medical history. She tried the IUD but got an infection with it and was recommended against having another.   She has a twin sister who had a hysterectomy. She would to avoid one if possible.     Past Medical History:  Diagnosis Date   Abnormal Pap smear of cervix    Colpo   Anxiety    Bipolar 1 disorder (HCC)    Bipolar 1 disorder (HCC)    Bipolar disorder (HCC)    Depression    Bipolar   OCD (obsessive compulsive disorder)    Personality disorder (HCC)     Past Surgical History:  Procedure Laterality Date   BLADDER REPAIR W/ CESAREAN SECTION     BUNIONECTOMY Bilateral    CESAREAN SECTION     CESAREAN SECTION N/A 09/02/2014   Procedure: CESAREAN SECTION;  Surgeon: Levie Heritage, DO;  Location: [facility];  Service: Obstetrics;  Laterality: N/A;   CHOLECYSTECTOMY     TUBAL LIGATION     WISDOM TOOTH EXTRACTION      The following portions of the patient's history were reviewed and updated as appropriate: allergies, current medications, past family history, past medical history, past social history, past surgical history and problem list.   Health Maintenance:   Normal pap and negative HRHPV on  02/2020 Diagnosis  Date Value Ref Range Status  02/28/2020   Final   - Negative for intraepithelial lesion or malignancy (NILM)    Review of Systems:  Pertinent items noted in HPI and remainder of comprehensive ROS otherwise negative.  Physical Exam:  BP (!) 132/90   Pulse 93   Temp (!) 96.3 F (35.7 C)   Ht 5\' 3"  (1.6 m)   Wt 233 lb (105.7 kg)   BMI 41.27 kg/m  CONSTITUTIONAL: Well-developed, well-nourished female in no acute distress.  HEENT:  Normocephalic, atraumatic. External right and left ear normal. No scleral icterus.  NECK: Normal range of motion, supple, no masses noted on observation SKIN: No rash noted. Not diaphoretic. No erythema. No pallor. MUSCULOSKELETAL: Normal range of motion. No edema noted. NEUROLOGIC: Alert and oriented to person, place, and time. Normal muscle tone coordination. No cranial nerve deficit noted. PSYCHIATRIC: Normal mood and affect. Normal behavior. Normal judgment and thought content.  PELVIC: Normal appearing external genitalia; normal urethral meatus; normal appearing vaginal mucosa.  Prior left vaginal cyst has resolved.  No abnormal discharge noted.   Performed in the presence of a chaperone  Labs and Imaging No results found for this or any previous visit (from the past 168 hour(s)). No results found.  Assessment and  Plan:  [place] was seen today for follow-up.  Diagnoses and all orders for this visit:  Vaginal cyst - Discussed options if recurs I.e. marsupialization vs excision. She has done I&D in the past but it comes back. Would advise marsupialization if recurs.   Menorrhagia with regular cycle - Last [place] in 2021 was normal. Suspect by history she has adenomyosis. I think unlikely fibroids based on it being painful bleeding. We will do TXA.  - Reviewed may be able to have [place] to help with bleeding which has been shown to be about 70% effective. I will reach out to IR team to see if an option.  - Discussed option of hysterectomy.  Reviewed greatest risk would be bladder injury due to prior c-sections. Agree with avoiding if possible - Would not do ablation due to suspicion of adenomyosis.  - Discussed hormonal options generally but she would like to avoid these as noted above.  -     tranexamic acid (LYSTEDA) 650 MG TABS tablet; Take 2 tablets (1,300 mg total) by mouth 3 (three) times daily. Take during menses for a maximum of five days -     Estradiol -     Follicle stimulating hormone -     Luteinizing hormone -     Prolactin -     TSH -     CBC  Hot flashes - Check hormone levels as noted above to rule out medical causes.  - Discussed vitamin E if all levels are normal.  -     CBC  Routine preventative health maintenance measures emphasized. Please refer to After Visit Summary for other counseling recommendations.   No follow-ups on file.  Milas Hock, MD, FACOG Obstetrician & Gynecologist, [facility], [facility]

## 2023-01-04 ENCOUNTER — Encounter: Payer: Self-pay | Admitting: Obstetrics and Gynecology

## 2023-01-04 ENCOUNTER — Ambulatory Visit: Payer: Medicaid Other | Admitting: Obstetrics and Gynecology

## 2023-01-04 VITALS — BP 132/90 | HR 93 | Temp 96.3°F | Ht 63.0 in | Wt 233.0 lb

## 2023-01-04 DIAGNOSIS — R232 Flushing: Secondary | ICD-10-CM

## 2023-01-04 DIAGNOSIS — N898 Other specified noninflammatory disorders of vagina: Secondary | ICD-10-CM | POA: Diagnosis not present

## 2023-01-04 DIAGNOSIS — N920 Excessive and frequent menstruation with regular cycle: Secondary | ICD-10-CM

## 2023-01-04 MED ORDER — TRANEXAMIC ACID 650 MG PO TABS
1300.0000 mg | ORAL_TABLET | Freq: Three times a day (TID) | ORAL | 2 refills | Status: DC
Start: 2023-01-04 — End: 2023-03-21

## 2023-01-05 LAB — CBC
Hematocrit: 40.4 % (ref 34.0–46.6)
Hemoglobin: 13.6 g/dL (ref 11.1–15.9)
MCH: 29.7 pg (ref 26.6–33.0)
MCHC: 33.7 g/dL (ref 31.5–35.7)
MCV: 88 fL (ref 79–97)
Platelets: 387 10*3/uL (ref 150–450)
RBC: 4.58 x10E6/uL (ref 3.77–5.28)
RDW: 12.1 % (ref 11.7–15.4)
WBC: 6.8 10*3/uL (ref 3.4–10.8)

## 2023-01-05 LAB — FOLLICLE STIMULATING HORMONE: FSH: 5.7 m[IU]/mL

## 2023-01-05 LAB — TSH: TSH: 0.712 u[IU]/mL (ref 0.450–4.500)

## 2023-01-05 LAB — PROLACTIN: Prolactin: 9.9 ng/mL (ref 4.8–33.4)

## 2023-01-05 LAB — LUTEINIZING HORMONE: LH: 4.9 m[IU]/mL

## 2023-01-05 LAB — ESTRADIOL: Estradiol: 101 pg/mL

## 2023-01-10 ENCOUNTER — Encounter: Payer: Self-pay | Admitting: Obstetrics and Gynecology

## 2023-02-22 ENCOUNTER — Other Ambulatory Visit: Payer: Self-pay | Admitting: Obstetrics and Gynecology

## 2023-02-22 DIAGNOSIS — N920 Excessive and frequent menstruation with regular cycle: Secondary | ICD-10-CM

## 2023-02-22 DIAGNOSIS — N8003 Adenomyosis of the uterus: Secondary | ICD-10-CM

## 2023-02-22 NOTE — Progress Notes (Signed)
Pt called and would like to look into possible [place] for adenomyosis. MRI order placed and IR referral placed.   Milas Hock, MD Attending Obstetrician & Gynecologist, [facility], [facility]

## 2023-03-07 NOTE — Progress Notes (Deleted)
   ANNUAL EXAM Patient name: Virginia Sanders MRN [id number]  Date of birth: [birth year] Chief Complaint:   No chief complaint on file.  History of Present Illness:   Virginia Sanders is a 32 y.o. [phone] female being seen today for a routine annual exam.   Current concerns: ***  Current birth control: tubal ligation  No LMP recorded.  Last Pap/Pap History:  Abnormal pap at age 62 96: normal per pt- with [place] 2018: normal pap 2021: normal/hpv neg   Health Maintenance Due  Topic Date Due   INFLUENZA VACCINE  01/26/2023   COVID-19 Vaccine (3 - 2023-24 season) 02/26/2023   PAP SMEAR-Modifier  02/28/2023    Review of Systems:   Pertinent items are noted in HPI Denies any headaches, blurred vision, fatigue, shortness of breath, chest pain, abdominal pain, abnormal vaginal discharge/itching/odor/irritation, problems with periods, bowel movements, urination, or intercourse unless otherwise stated above. *** Pertinent History Reviewed:  Reviewed past medical,surgical, social and family history.  Reviewed problem list, medications and allergies. Physical Assessment:  There were no vitals filed for this visit.There is no height or weight on file to calculate BMI.   Physical Examination:  General appearance - well appearing, and in no distress Mental status - alert, oriented to person, place, and time Psych:  She has a normal mood and affect Skin - warm and dry, normal color, no suspicious lesions noted Chest - effort normal Heart - normal rate  Breasts - breasts appear normal, no suspicious masses, no skin or nipple changes or axillary nodes Abdomen - soft, nontender, nondistended, no masses or organomegaly Pelvic -  VULVA: normal appearing vulva with no masses, tenderness or lesions  VAGINA: normal appearing vagina with normal color and discharge, no lesions  CERVIX: normal appearing cervix without discharge or lesions, no CMT UTERUS: uterus is felt to be normal size,  shape, consistency and nontender  ADNEXA: No adnexal masses or tenderness noted. Extremities:  No swelling or varicosities noted  Chaperone present for exam  No results found for this or any previous visit (from the past 24 hour(s)).  Assessment & Plan:  Diagnoses and all orders for this visit:  PCOS (polycystic ovarian syndrome)  Menorrhagia with regular cycle  Abnormal cervical Papanicolaou smear, unspecified abnormal pap finding  Encounter for annual routine gynecological examination  - Cervical cancer screening: Discussed guidelines. Pap with HPV done - Gardasil: {Blank single:19197::"***","has not yet had. Will provide information","completed","has not yet had. Counseling provided and she declines","Has not yet had. Counseling provided and pt accepts"} - GC/CT: {Blank single:19197::"accepts","declines","not indicated"} - Birth Control:  tubal ligation - Breast Health: Encouraged self breast awareness/SBE. Teaching provided.  - F/U 12 months and prn   - Labwork for FLP and HgBA1C: Due - new Rx given - Reviewed possible help of Myoinositiol/D-chiroinositol with weight loss. Offered referral to dietician. She {Blank single:19197::"Accepts","declines"}.  - Discussed importance of endometrial protection with different hormonal methods to prevent hyperplasia/malignancy in the setting of oligomenorrhea.  - She would like to try: {Birth control type:23956}     No orders of the defined types were placed in this encounter.   Meds: No orders of the defined types were placed in this encounter.   Follow-up: No follow-ups on file.  Milas Hock, MD 03/07/2023 9:39 AM

## 2023-03-08 ENCOUNTER — Ambulatory Visit: Payer: Medicaid Other | Admitting: Obstetrics and Gynecology

## 2023-03-08 DIAGNOSIS — N920 Excessive and frequent menstruation with regular cycle: Secondary | ICD-10-CM

## 2023-03-08 DIAGNOSIS — E282 Polycystic ovarian syndrome: Secondary | ICD-10-CM

## 2023-03-08 DIAGNOSIS — Z01419 Encounter for gynecological examination (general) (routine) without abnormal findings: Secondary | ICD-10-CM

## 2023-03-08 DIAGNOSIS — R87619 Unspecified abnormal cytological findings in specimens from cervix uteri: Secondary | ICD-10-CM

## 2023-03-21 ENCOUNTER — Ambulatory Visit: Payer: Medicaid Other | Admitting: Obstetrics and Gynecology

## 2023-03-21 ENCOUNTER — Encounter: Payer: Self-pay | Admitting: Obstetrics and Gynecology

## 2023-03-21 ENCOUNTER — Other Ambulatory Visit (HOSPITAL_COMMUNITY)
Admission: RE | Admit: 2023-03-21 | Discharge: 2023-03-21 | Disposition: A | Discharge status: Home / Self Care | Payer: Medicaid Other | Source: Ambulatory Visit | Attending: Obstetrics and Gynecology | Admitting: Obstetrics and Gynecology

## 2023-03-21 VITALS — BP 124/84 | HR 90 | Ht 63.0 in | Wt 233.0 lb

## 2023-03-21 DIAGNOSIS — N760 Acute vaginitis: Secondary | ICD-10-CM

## 2023-03-21 DIAGNOSIS — T7491XA Unspecified adult maltreatment, confirmed, initial encounter: Secondary | ICD-10-CM | POA: Diagnosis not present

## 2023-03-21 DIAGNOSIS — Z124 Encounter for screening for malignant neoplasm of cervix: Secondary | ICD-10-CM | POA: Diagnosis not present

## 2023-03-21 DIAGNOSIS — R3915 Urgency of urination: Secondary | ICD-10-CM | POA: Diagnosis present

## 2023-03-21 DIAGNOSIS — N920 Excessive and frequent menstruation with regular cycle: Secondary | ICD-10-CM | POA: Diagnosis not present

## 2023-03-21 DIAGNOSIS — Z113 Encounter for screening for infections with a predominantly sexual mode of transmission: Secondary | ICD-10-CM | POA: Diagnosis present

## 2023-03-21 DIAGNOSIS — B9689 Other specified bacterial agents as the cause of diseases classified elsewhere: Secondary | ICD-10-CM

## 2023-03-21 DIAGNOSIS — Z01419 Encounter for gynecological examination (general) (routine) without abnormal findings: Secondary | ICD-10-CM

## 2023-03-21 LAB — POCT URINALYSIS DIPSTICK
Bilirubin, UA: NEGATIVE
Blood, UA: NEGATIVE
Glucose, UA: NEGATIVE
Ketones, UA: NEGATIVE
Nitrite, UA: NEGATIVE
Protein, UA: NEGATIVE
Spec Grav, UA: 1.015 (ref 1.010–1.025)
Urobilinogen, UA: NEGATIVE E.U./dL — AB
pH, UA: 7 (ref 5.0–8.0)

## 2023-03-21 MED ORDER — TRANEXAMIC ACID 650 MG PO TABS
1300.0000 mg | ORAL_TABLET | Freq: Three times a day (TID) | ORAL | 2 refills | Status: DC
Start: 2023-03-21 — End: 2023-12-20
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
## 2023-03-23 LAB — CERVICOVAGINAL ANCILLARY ONLY
Bacterial Vaginitis (gardnerella): POSITIVE — AB
Candida Glabrata: NEGATIVE
Candida Vaginitis: NEGATIVE
Chlamydia: NEGATIVE
Comment: NEGATIVE
Comment: NEGATIVE
Comment: NEGATIVE
Comment: NEGATIVE
Comment: NEGATIVE
Comment: NORMAL
Neisseria Gonorrhea: NEGATIVE
Trichomonas: NEGATIVE

## 2023-03-23 LAB — URINE CULTURE

## 2023-03-23 MED ORDER — METRONIDAZOLE 500 MG PO TABS
500.0000 mg | ORAL_TABLET | Freq: Two times a day (BID) | ORAL | 0 refills | Status: AC
Start: 2023-03-23 — End: 2023-03-30

## 2023-03-23 NOTE — Addendum Note (Signed)
Addended by: Sue Lush on: 03/23/2023 02:04 PM   Modules accepted: Orders

## 2023-03-25 ENCOUNTER — Telehealth: Payer: Medicaid Other | Admitting: Family Medicine

## 2023-03-25 DIAGNOSIS — N750 Cyst of Bartholin's gland: Secondary | ICD-10-CM

## 2023-03-25 NOTE — Patient Instructions (Signed)
Scherry Ran, thank you for joining Reed Pandy, PA-C for today's virtual visit.  While this provider is not your primary care provider (PCP), if your PCP is located in our provider database this encounter information will be shared with them immediately following your visit.   A [facility] MyChart account gives you access to today's visit and all your visits, tests, and labs performed at [facility] " click here if you don't have a [facility] MyChart account or go to [email or web address]  Consent: (Patient) Virginia Sanders provided verbal consent for this virtual visit at the beginning of the encounter.  Current Medications:  Current Outpatient Medications:    hydrOXYzine (ATARAX) 25 MG tablet, Take 25 mg by mouth 3 (three) times daily as needed., Disp: , Rfl:    metroNIDAZOLE (FLAGYL) 500 MG tablet, Take 1 tablet (500 mg total) by mouth 2 (two) times daily for 7 days., Disp: 14 tablet, Rfl: 0   OLANZapine (ZYPREXA) 2.5 MG tablet, Take 2.5 mg by mouth 3 (three) times daily., Disp: , Rfl:    spironolactone (ALDACTONE) 25 MG tablet, Take 100 mg by mouth daily., Disp: , Rfl:    tranexamic acid (LYSTEDA) 650 MG TABS tablet, Take 2 tablets (1,300 mg total) by mouth 3 (three) times daily. Take during menses for a maximum of five days, Disp: 30 tablet, Rfl: 2   traZODone (DESYREL) 100 MG tablet, , Disp: , Rfl:    Medications ordered in this encounter:  No orders of the defined types were placed in this encounter.    *If you need refills on other medications prior to your next appointment, please contact your pharmacy*  Follow-Up: Call back or seek an in-person evaluation if the symptoms worsen or if the condition fails to improve as anticipated.  [facility] [phone]  Other Instructions Bartholin's Cyst  A Bartholin's cyst is a fluid-filled sac that forms on a Bartholin's gland. Bartholin's glands are small glands in the folds of skin near the  opening of the vagina (labia). This type of cyst causes a bulge or lump near the opening of the vagina. If you have a cyst that is small and not infected, you may be able to take care of it at home. If your cyst gets infected, it may cause pain and your doctor may need to drain it. What are the causes? This condition may be caused by a blocked Bartholin's gland. Germs (bacteria) inside of the cyst can cause an infection. What are the signs or symptoms? A bulge or lump near the opening of the vagina. Discomfort or pain. Redness, swelling, or fluid draining from the area. How is this treated? You may not need treatment if your cyst is not causing symptoms. The cyst can go away on its own with home care. Home care includes hot baths or heat therapy. Large cysts or cysts that are infected may be treated with: Antibiotic medicine. A procedure to drain the fluid. Cysts that keep coming back will need to be drained many times. Your doctor may talk to you about surgery to remove the cyst. Follow these instructions at home: Medicines Take over-the-counter and prescription medicines only as told by your doctor. If you were prescribed an antibiotic medicine, take it as told by your doctor. Do not stop taking it even if you start to feel better. Managing pain and swelling Try sitz baths to help with pain and swelling. A sitz bath is a warm water bath in which the water only  comes up to your hips and should cover your buttocks. You may take sitz baths a few times a day. If told, put heat on the affected area as often as needed. Use the heat source that your doctor recommends, such as a moist heat pack or a heating pad. Place a towel between your skin and the heat source. Leave the heat on for 20-30 minutes. Take off the heat if your skin turns bright red. This is very important. If you cannot feel pain, heat, or cold, you have a greater risk of getting burned. General instructions If your cyst was  drained: Follow instructions from your doctor about how to take care of your wound. Use feminine pads to absorb any fluid. Do not push on or squeeze your cyst. Do not have sex until the cyst has gone away or your wound from drainage has healed. Take these steps to help prevent a cyst from returning, and to prevent other cysts from forming: Take a bath or shower once a day. Clean the area around your vagina with mild soap and water when you bathe. Practice safe sex to prevent STIs. Talk with your doctor about how to prevent STIs and which forms of birth control to use. Keep all follow-up visits. Contact a doctor if: You have a fever. You get more redness, swelling, or pain around your cyst. You have fluid, blood, pus, or a bad smell coming from your cyst. You have a cyst that gets larger or a cyst that comes back. Summary A Bartholin's cyst is a fluid-filled sac that forms on a Bartholin's gland. These small glands are found in the folds of skin near the opening of the vagina (labia). This type of cyst causes a bulge or lump near the opening of the vagina. Try sitz baths a few times a day to help with pain and swelling. Do not push on or squeeze your cyst. This information is not intended to replace advice given to you by your health care provider. Make sure you discuss any questions you have with your health care provider. Document Revised: 11/11/2019 Document Reviewed: 11/11/2019 Elsevier Patient Education  2024 Elsevier Inc.    If you have been instructed to have an in-person evaluation today at a local Urgent Care facility, please use the link below. It will take you to a list of all of our available [facility], including address, phone number and hours of operation. Please do not delay care.  [facility]  If you or a family member do not have a primary care provider, use the link below to schedule a visit and establish care. When you choose a [facility]  primary care physician or advanced practice provider, you gain a long-term partner in health. Find a Primary Care Provider  Learn more about [facility]'s in-office and virtual care options: [facility] - Get Care Now
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
## 2023-03-26 ENCOUNTER — Telehealth: Payer: Medicaid Other | Admitting: Nurse Practitioner

## 2023-03-26 DIAGNOSIS — N750 Cyst of Bartholin's gland: Secondary | ICD-10-CM | POA: Diagnosis not present
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
## 2023-03-26 NOTE — Patient Instructions (Signed)
  Scherry Ran, thank you for joining Claiborne Rigg, NP for today's virtual visit.  While this provider is not your primary care provider (PCP), if your PCP is located in our provider database this encounter information will be shared with them immediately following your visit.   A [facility] MyChart account gives you access to today's visit and all your visits, tests, and labs performed at [facility] " click here if you don't have a [facility] MyChart account or go to [email or web address]  Consent: (Patient) Virginia Sanders provided verbal consent for this virtual visit at the beginning of the encounter.  Current Medications:  Current Outpatient Medications:    hydrOXYzine (ATARAX) 25 MG tablet, Take 25 mg by mouth 3 (three) times daily as needed., Disp: , Rfl:    metroNIDAZOLE (FLAGYL) 500 MG tablet, Take 1 tablet (500 mg total) by mouth 2 (two) times daily for 7 days., Disp: 14 tablet, Rfl: 0   OLANZapine (ZYPREXA) 2.5 MG tablet, Take 2.5 mg by mouth 3 (three) times daily., Disp: , Rfl:    spironolactone (ALDACTONE) 25 MG tablet, Take 100 mg by mouth daily., Disp: , Rfl:    tranexamic acid (LYSTEDA) 650 MG TABS tablet, Take 2 tablets (1,300 mg total) by mouth 3 (three) times daily. Take during menses for a maximum of five days, Disp: 30 tablet, Rfl: 2   traZODone (DESYREL) 100 MG tablet, , Disp: , Rfl:    Medications ordered in this encounter:  No orders of the defined types were placed in this encounter.    *If you need refills on other medications prior to your next appointment, please contact your pharmacy*  Follow-Up: Call back or seek an in-person evaluation if the symptoms worsen or if the condition fails to improve as anticipated.  [facility] [phone]  Other Instructions Currently does not meet criteria for abx. The cyst is still contained and no signs of infection at this time.    If you have been instructed to have an in-person  evaluation today at a local Urgent Care facility, please use the link below. It will take you to a list of all of our available [facility], including address, phone number and hours of operation. Please do not delay care.  [facility]  If you or a family member do not have a primary care provider, use the link below to schedule a visit and establish care. When you choose a [facility] primary care physician or advanced practice provider, you gain a long-term partner in health. Find a Primary Care Provider  Learn more about [facility]'s in-office and virtual care options: [facility] - Get Care Now

## 2023-03-29 ENCOUNTER — Encounter: Payer: MEDICAID | Admitting: Obstetrics and Gynecology

## 2023-03-29 LAB — CYTOLOGY - PAP
Comment: NEGATIVE
Diagnosis: NEGATIVE
High risk HPV: NEGATIVE

## 2023-04-03 ENCOUNTER — Other Ambulatory Visit: Payer: MEDICAID

## 2023-04-04 ENCOUNTER — Encounter: Payer: Self-pay | Admitting: Obstetrics and Gynecology

## 2023-04-04 DIAGNOSIS — B3731 Acute candidiasis of vulva and vagina: Secondary | ICD-10-CM

## 2023-04-04 MED ORDER — FLUCONAZOLE 150 MG PO TABS
150.0000 mg | ORAL_TABLET | ORAL | 3 refills | Status: DC
Start: 2023-04-04 — End: 2023-12-20

## 2023-04-10 ENCOUNTER — Other Ambulatory Visit: Payer: MEDICAID

## 2023-11-28 ENCOUNTER — Telehealth: Payer: Self-pay | Admitting: *Deleted

## 2023-11-28 NOTE — Telephone Encounter (Signed)
 Voicemail not set up.

## 2023-12-13 ENCOUNTER — Encounter: Payer: Self-pay | Admitting: Obstetrics and Gynecology

## 2023-12-13 ENCOUNTER — Other Ambulatory Visit (HOSPITAL_COMMUNITY)
Admission: RE | Admit: 2023-12-13 | Discharge: 2023-12-13 | Disposition: A | Discharge status: Home / Self Care | Payer: MEDICAID | Source: Ambulatory Visit | Attending: Obstetrics and Gynecology | Admitting: Obstetrics and Gynecology

## 2023-12-13 ENCOUNTER — Ambulatory Visit: Payer: MEDICAID | Admitting: Obstetrics and Gynecology

## 2023-12-13 VITALS — BP 120/82 | HR 86 | Ht 63.0 in | Wt 230.0 lb

## 2023-12-13 DIAGNOSIS — N939 Abnormal uterine and vaginal bleeding, unspecified: Secondary | ICD-10-CM

## 2023-12-14 LAB — CERVICOVAGINAL ANCILLARY ONLY
Chlamydia: NEGATIVE
Comment: NEGATIVE
Comment: NEGATIVE
Comment: NORMAL
Neisseria Gonorrhea: NEGATIVE
Trichomonas: NEGATIVE

## 2023-12-15 ENCOUNTER — Ambulatory Visit: Payer: Self-pay | Admitting: Obstetrics and Gynecology

## 2023-12-15 LAB — SURGICAL PATHOLOGY

## 2023-12-18 ENCOUNTER — Ambulatory Visit (INDEPENDENT_AMBULATORY_CARE_PROVIDER_SITE_OTHER): Payer: MEDICAID

## 2023-12-18 DIAGNOSIS — N939 Abnormal uterine and vaginal bleeding, unspecified: Secondary | ICD-10-CM | POA: Diagnosis not present

## 2023-12-18 NOTE — Progress Notes (Signed)
      GYNECOLOGY OFFICE PROCEDURE NOTE   Virginia Sanders is a 33 y.o. [id number] here for endometrial biopsy for AUB.     ENDOMETRIAL BIOPSY     The indications for endometrial biopsy were reviewed.   Risks of the biopsy including cramping, bleeding, infection, uterine perforation, inadequate specimen and need for additional procedures were discussed. Offered alternative of hysteroscopy, dilation and curettage in OR. The patient states she understands the R/B/I/A and agrees to undergo procedure today. Urine pregnancy test was Negative. Consent was signed. Time out was performed.    Patient was positioned in dorsal lithotomy position. A vaginal speculum was placed.  The cervix was visualized and was prepped with Betadine. 3cc of lidocaine 1% was injected intracervically at 12 o'clock.  A single-toothed tenaculum was placed on the anterior lip of the cervix to stabilize it. The 3 mm pipelle was easily introduced into the endometrial cavity without difficulty to a depth of 6-7 cm but then patient had extreme pain. Biopsy was terminated without reaching fundus and what is likely just mucus and scant tissue was obtained. Sample sent to pathology. The instruments were removed from the patient's vagina. Minimal bleeding from the cervix was noted. The patient tolerated the procedure with significant pain.   Patient was given post procedure instructions.  Will follow up pathology and manage accordingly; patient will be contacted with results and recommendations.  Routine preventative health maintenance measures emphasized.   Kieth Carolin, MD Obstetrician & Gynecologist, [facility] for [employer], [facility]

## 2023-12-18 NOTE — Progress Notes (Unsigned)
   RETURN GYNECOLOGY VISIT  Subjective:  Virginia Sanders is a 33 y.o. [id number] with LMP 11/30/23 presenting for AUB  Patient has known history of painful heavy periods.   Of note, patient has history of IV methamphetamine use (seen on chart review from PCP visit 11/15/23)  Objective:   Vitals:   12/13/23 1429  BP: 120/82  Pulse: 86  Weight: 230 lb (104.3 kg)  Height: 5' 3 (1.6 m)    General:  Alert, oriented and cooperative. Patient is in no acute distress.  Skin: Skin is warm and dry. No rash noted.   Cardiovascular: Normal heart rate noted  Respiratory: Normal respiratory effort, no problems with respiration noted  Abdomen: Soft, non-tender, non-distended   Pelvic: NEFG.   Exam performed in the presence of a chaperone  Assessment and Plan:  Virginia Sanders is a 33 y.o. with ***  1. Abnormal uterine bleeding (AUB) (Primary) *** - Surgical pathology( [place]/ [facility]) - US  PELVIC COMPLETE WITH TRANSVAGINAL; Future - Cervicovaginal ancillary only( [place])   No follow-ups on file.  Future Appointments  Date Time Provider Department Center  12/18/2023  1:00 PM [facility]- US  1 [facility]US  [facility]    Kieth JAYSON Carolin, MD

## 2023-12-20 NOTE — Patient Instructions (Signed)
It is normal to have cramping and bleeding for the next 2-3 days. You should feel better every day.  Please call us if you have any severe pain, bleeding that soaks more than 1 pad in a hour, have fevers, or feel like you're going to pass out.  You can take tylenol '1000mg'$  every 8 hours and ibuprofen '800mg'$  every 8 hours as needed for pain. It is ok to take both at the same time.

## 2024-01-06 ENCOUNTER — Encounter: Payer: Self-pay | Admitting: Obstetrics and Gynecology

## 2024-07-01 ENCOUNTER — Encounter: Payer: Self-pay | Admitting: Obstetrics and Gynecology

## 2024-07-01 ENCOUNTER — Ambulatory Visit: Payer: MEDICAID | Admitting: Obstetrics and Gynecology

## 2024-07-01 VITALS — BP 145/82 | HR 94 | Ht 63.0 in | Wt 213.0 lb

## 2024-07-01 DIAGNOSIS — N762 Acute vulvitis: Secondary | ICD-10-CM

## 2024-07-01 DIAGNOSIS — N939 Abnormal uterine and vaginal bleeding, unspecified: Secondary | ICD-10-CM | POA: Diagnosis not present

## 2024-07-01 MED ORDER — AMOXICILLIN-POT CLAVULANATE 875-125 MG PO TABS: 1.0000 | ORAL_TABLET | Freq: Two times a day (BID) | ORAL | 0 refills | Status: AC

## 2024-07-01 MED ORDER — DOXYCYCLINE HYCLATE 100 MG PO CAPS: 100.0000 mg | ORAL_CAPSULE | Freq: Two times a day (BID) | ORAL | 0 refills | Status: AC

## 2024-07-01 NOTE — Progress Notes (Signed)
" ° °  RETURN GYNECOLOGY VISIT  Subjective:  Virginia Sanders is a 34 y.o. [phone] presenting for bartholin's cyst  Left sided labial cyst got to the size of an apple and then burst 1/31 and drained pus. Burst again this morning in the shower and just got blood out. Has had episodes of sweats but did not take temp so not sure if she had a fever. No nausea/vomiting.   Separately, has AUB suspected to be 2/2 adenomyosis and was planning pelvic MRI to see if she would be a candidate for UAE. The order expired.  Objective:   Vitals:   07/01/24 0914 07/01/24 0917  BP: (!) 143/94 (!) 145/82  Pulse: 96 94  Weight: 213 lb (96.6 kg)   Height: 5' 3 (1.6 m)    General:  Alert, oriented and cooperative. Patient is in no acute distress.  Skin: Skin is warm and dry. No rash noted.   Cardiovascular: Normal heart rate noted  Respiratory: Normal respiratory effort, no problems with respiration noted  Abdomen: Soft, non-tender, non-distended   Pelvic: Left labia majora with mild edema, erythema and tenderness c/f cellulitis. Can see pinpoint area of drainage lateral to the clitoris. No e/o bartholin's cyst. No area of fluctuance  Exam performed in the presence of a chaperone  Assessment and Plan:  Virginia Sanders is a 34 y.o. with vulvar cellulitis  Vulvar cellulitis Nothing to drain on exam today, but c/f cellulitis so will give abx x 7d. Has sulfa allergy Discussed warm compresses & sitz baths. Follow up if symptoms worsen or do not improve -     doxycycline  (VIBRAMYCIN ) 100 MG capsule; Take 1 capsule (100 mg total) by mouth 2 (two) times daily for 7 days. -     amoxicillin -clavulanate (AUGMENTIN ) 875-125 MG tablet; Take 1 tablet by mouth 2 (two) times daily for 7 days.  Abnormal uterine bleeding (AUB) MRI reordered to eval for UAE -     MR PELVIS W CONTRAST; Future  Return if symptoms worsen or fail to improve.  Kieth JAYSON Carolin, MD  "

## 2024-07-01 NOTE — Patient Instructions (Signed)
 Because of your sulfa allergy, you will need two antibiotics to cover all the possible causes of your skin infection. You should start feeling better relatively quickly, but keep taking the antibiotics until you run out. Take them with food to minimize side effects.  Please let us  know if you aren't feeling better by Thursday.
